# Patient Record
Sex: Female | Born: 1950 | Race: White | Hispanic: No | Smoking: Never smoker
Health system: Southern US, Community
[De-identification: ages and names within clinical notes are randomized; demographics above are authoritative.]

## PROBLEM LIST (undated history)

## (undated) DIAGNOSIS — Z9221 Personal history of antineoplastic chemotherapy: Secondary | ICD-10-CM

## (undated) DIAGNOSIS — E119 Type 2 diabetes mellitus without complications: Secondary | ICD-10-CM

## (undated) DIAGNOSIS — I509 Heart failure, unspecified: Secondary | ICD-10-CM

## (undated) DIAGNOSIS — F32A Depression, unspecified: Secondary | ICD-10-CM

## (undated) DIAGNOSIS — L409 Psoriasis, unspecified: Secondary | ICD-10-CM

## (undated) DIAGNOSIS — F419 Anxiety disorder, unspecified: Secondary | ICD-10-CM

## (undated) DIAGNOSIS — C189 Malignant neoplasm of colon, unspecified: Secondary | ICD-10-CM

## (undated) DIAGNOSIS — I517 Cardiomegaly: Secondary | ICD-10-CM

## (undated) DIAGNOSIS — F329 Major depressive disorder, single episode, unspecified: Secondary | ICD-10-CM

## (undated) DIAGNOSIS — I482 Chronic atrial fibrillation, unspecified: Secondary | ICD-10-CM

## (undated) DIAGNOSIS — L4050 Arthropathic psoriasis, unspecified: Secondary | ICD-10-CM

## (undated) DIAGNOSIS — E785 Hyperlipidemia, unspecified: Secondary | ICD-10-CM

## (undated) HISTORY — PX: COLON RESECTION: SHX5231

## (undated) HISTORY — PX: THYROIDECTOMY: SHX17

## (undated) HISTORY — PX: ATRIAL FIBRILLATION ABLATION: SHX5732

## (undated) HISTORY — DX: Psoriasis, unspecified: L40.9

## (undated) HISTORY — PX: CHOLECYSTECTOMY: SHX55

---
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
## 2000-04-14 DIAGNOSIS — B1920 Unspecified viral hepatitis C without hepatic coma: Secondary | ICD-10-CM

## 2000-04-14 DIAGNOSIS — C189 Malignant neoplasm of colon, unspecified: Secondary | ICD-10-CM

## 2000-04-14 HISTORY — DX: Malignant neoplasm of colon, unspecified: C18.9

## 2000-04-14 HISTORY — DX: Unspecified viral hepatitis C without hepatic coma: B19.20

## 2004-04-24 ENCOUNTER — Ambulatory Visit: Payer: Self-pay

## 2006-04-14 DIAGNOSIS — I219 Acute myocardial infarction, unspecified: Secondary | ICD-10-CM

## 2006-04-14 HISTORY — DX: Acute myocardial infarction, unspecified: I21.9

## 2006-08-14 ENCOUNTER — Ambulatory Visit: Payer: Self-pay | Admitting: Cardiovascular Disease

## 2006-08-18 ENCOUNTER — Ambulatory Visit: Payer: Self-pay | Admitting: Cardiovascular Disease

## 2009-04-18 ENCOUNTER — Ambulatory Visit: Payer: Self-pay

## 2009-08-06 ENCOUNTER — Inpatient Hospital Stay: Payer: Self-pay | Admitting: Surgery

## 2009-08-23 ENCOUNTER — Ambulatory Visit: Payer: Self-pay | Admitting: Surgery

## 2009-08-29 ENCOUNTER — Ambulatory Visit: Payer: Self-pay | Admitting: Surgery

## 2010-03-25 ENCOUNTER — Ambulatory Visit: Payer: Self-pay | Admitting: Cardiovascular Disease

## 2010-10-10 ENCOUNTER — Other Ambulatory Visit: Payer: Self-pay | Admitting: Physician Assistant

## 2010-10-11 ENCOUNTER — Other Ambulatory Visit: Payer: Self-pay | Admitting: Physician Assistant

## 2010-10-11 ENCOUNTER — Ambulatory Visit: Payer: Self-pay | Admitting: Cardiovascular Disease

## 2010-10-23 ENCOUNTER — Ambulatory Visit: Payer: Self-pay | Admitting: Gastroenterology

## 2016-01-23 ENCOUNTER — Ambulatory Visit: Payer: Self-pay

## 2016-04-14 DIAGNOSIS — R42 Dizziness and giddiness: Secondary | ICD-10-CM

## 2016-04-14 HISTORY — DX: Dizziness and giddiness: R42

## 2017-06-18 ENCOUNTER — Other Ambulatory Visit: Payer: Self-pay | Admitting: Nurse Practitioner

## 2017-06-18 DIAGNOSIS — Z1231 Encounter for screening mammogram for malignant neoplasm of breast: Secondary | ICD-10-CM

## 2017-07-29 ENCOUNTER — Encounter (HOSPITAL_COMMUNITY): Payer: Self-pay

## 2017-07-29 ENCOUNTER — Ambulatory Visit
Admission: RE | Admit: 2017-07-29 | Discharge: 2017-07-29 | Disposition: A | Discharge status: Home / Self Care | Payer: Medicare Other | Source: Ambulatory Visit | Attending: Nurse Practitioner | Admitting: Nurse Practitioner

## 2017-07-29 DIAGNOSIS — Z1231 Encounter for screening mammogram for malignant neoplasm of breast: Secondary | ICD-10-CM | POA: Diagnosis present

## 2017-07-29 HISTORY — DX: Personal history of antineoplastic chemotherapy: Z92.21

## 2017-07-29 HISTORY — DX: Malignant neoplasm of colon, unspecified: C18.9

## 2018-03-08 ENCOUNTER — Other Ambulatory Visit: Payer: Self-pay | Admitting: Nurse Practitioner

## 2018-03-08 DIAGNOSIS — Z13820 Encounter for screening for osteoporosis: Secondary | ICD-10-CM

## 2018-03-15 ENCOUNTER — Ambulatory Visit
Admission: RE | Admit: 2018-03-15 | Discharge: 2018-03-15 | Disposition: A | Discharge status: Home / Self Care | Payer: Medicare Other | Source: Ambulatory Visit | Attending: Nurse Practitioner | Admitting: Nurse Practitioner

## 2018-03-15 DIAGNOSIS — Z780 Asymptomatic menopausal state: Secondary | ICD-10-CM | POA: Insufficient documentation

## 2018-03-15 DIAGNOSIS — Z13820 Encounter for screening for osteoporosis: Secondary | ICD-10-CM

## 2019-03-20 DIAGNOSIS — U071 COVID-19: Secondary | ICD-10-CM

## 2019-03-20 HISTORY — DX: COVID-19: U07.1

## 2019-06-13 ENCOUNTER — Other Ambulatory Visit: Payer: Self-pay | Admitting: Family Medicine

## 2019-06-13 DIAGNOSIS — Z1231 Encounter for screening mammogram for malignant neoplasm of breast: Secondary | ICD-10-CM

## 2019-06-25 DIAGNOSIS — L409 Psoriasis, unspecified: Secondary | ICD-10-CM | POA: Insufficient documentation

## 2019-06-29 ENCOUNTER — Other Ambulatory Visit: Payer: Self-pay

## 2019-06-29 ENCOUNTER — Ambulatory Visit
Admission: RE | Admit: 2019-06-29 | Discharge: 2019-06-29 | Disposition: A | Discharge status: Home / Self Care | Payer: Medicare Other | Source: Ambulatory Visit | Attending: Family Medicine | Admitting: Family Medicine

## 2019-06-29 DIAGNOSIS — Z1231 Encounter for screening mammogram for malignant neoplasm of breast: Secondary | ICD-10-CM

## 2019-07-06 ENCOUNTER — Other Ambulatory Visit: Payer: Self-pay | Admitting: Family Medicine

## 2019-07-06 DIAGNOSIS — N6489 Other specified disorders of breast: Secondary | ICD-10-CM

## 2019-07-06 DIAGNOSIS — R928 Other abnormal and inconclusive findings on diagnostic imaging of breast: Secondary | ICD-10-CM

## 2019-07-12 ENCOUNTER — Other Ambulatory Visit: Payer: Self-pay

## 2019-07-12 ENCOUNTER — Ambulatory Visit (INDEPENDENT_AMBULATORY_CARE_PROVIDER_SITE_OTHER): Payer: Medicare Other | Admitting: Dermatology

## 2019-07-12 ENCOUNTER — Encounter: Payer: Self-pay | Admitting: Dermatology

## 2019-07-12 DIAGNOSIS — B079 Viral wart, unspecified: Secondary | ICD-10-CM | POA: Diagnosis not present

## 2019-07-12 DIAGNOSIS — L409 Psoriasis, unspecified: Secondary | ICD-10-CM | POA: Diagnosis not present

## 2019-07-12 DIAGNOSIS — L821 Other seborrheic keratosis: Secondary | ICD-10-CM

## 2019-07-12 NOTE — Patient Instructions (Signed)
Liquid nitrogen was applied for 10-12 seconds to the skin lesion and the expected blistering or scabbing reaction explained. Do not pick at the area. Patient reminded to expect hypopigmented scars from the procedure. Return if lesion fails to fully resolve.

## 2019-07-12 NOTE — Progress Notes (Signed)
   Follow-Up Visit   Subjective  Diane Gardner is a 69 y.o. female who presents for the following: Psoriasis (follow up, talk about psoriatic arthritis, and starting Cosentyx).  Pt was unable to tolerate Otezla.  Ears still extremely itchy.  Scalp somewhat improved, but still itches.  Want her scalp checked because she has felt a bump there off and on.  She also has a painful new wart on her finger she would like removed.   The following portions of the chart were reviewed this encounter and updated as appropriate:    Review of Systems: No other skin or systemic complaints.  Objective  Well appearing patient in no apparent distress; mood and affect are within normal limits.  A focused examination was performed including scalp, ears, arms. Relevant physical exam findings are noted in the Assessment and Plan.  Objective  Left Ear, Mid Frontal Scalp, Right Ear, Right Proximal 3rd Finger: Erythema and scale B/L ear concha and canals with excoriations, mild erythema/scale scalp.   Objective  Crown: Stuck-on, waxy, tan-brown macule --Discussed benign etiology and prognosis.   Objective  Right Distal 4th Finger: 3 mm Verrucous papule  Assessment & Plan  Psoriasis (4) Left Ear; Right Ear; Right Proximal 3rd Finger; Mid Frontal Scalp  With psoriatic arthritis Continue Dermasmoothe Oil to scalp and ears as directed. Continue Ketoconazole 2% shampoo as directed. Pt is approved for Cosentyx, but copay is not affordable. She will fill out the paperwork for patient assistance and submit.  Seborrheic keratosis Crown  Benign, observe.    Viral warts, unspecified type Right Distal 4th Finger  Discussed viral etiology and risk of spread.  Discussed multiple treatments may be required to clear warts.  Discussed possible post-treatment dyspigmentation and risk of recurrence.   Destruction of lesion - Right Distal 4th Finger  Destruction method: cryotherapy   Informed consent:  discussed and consent obtained   Lesion destroyed using liquid nitrogen: Yes   Region frozen until ice ball extended beyond lesion: Yes   Outcome: patient tolerated procedure well with no complications   Post-procedure details: wound care instructions given    Return in about 4 weeks (around 08/09/2019) for wart/cosentyx start?Marene Lenz, CMA, am acting as scribe for Brendolyn Patty, MD .

## 2019-07-14 ENCOUNTER — Ambulatory Visit
Admission: RE | Admit: 2019-07-14 | Discharge: 2019-07-14 | Disposition: A | Discharge status: Home / Self Care | Payer: Medicare Other | Source: Ambulatory Visit | Attending: Family Medicine | Admitting: Family Medicine

## 2019-07-14 DIAGNOSIS — R928 Other abnormal and inconclusive findings on diagnostic imaging of breast: Secondary | ICD-10-CM | POA: Insufficient documentation

## 2019-07-14 DIAGNOSIS — N6489 Other specified disorders of breast: Secondary | ICD-10-CM | POA: Insufficient documentation

## 2019-07-20 ENCOUNTER — Other Ambulatory Visit: Payer: Self-pay

## 2019-08-12 ENCOUNTER — Ambulatory Visit (INDEPENDENT_AMBULATORY_CARE_PROVIDER_SITE_OTHER): Payer: Medicare Other | Admitting: Dermatology

## 2019-08-12 ENCOUNTER — Other Ambulatory Visit: Payer: Self-pay

## 2019-08-12 DIAGNOSIS — L4050 Arthropathic psoriasis, unspecified: Secondary | ICD-10-CM

## 2019-08-12 DIAGNOSIS — Z79899 Other long term (current) drug therapy: Secondary | ICD-10-CM

## 2019-08-12 DIAGNOSIS — L409 Psoriasis, unspecified: Secondary | ICD-10-CM

## 2019-08-12 MED ORDER — SECUKINUMAB (300 MG DOSE) 150 MG/ML ~~LOC~~ SOAJ
300.0000 mg | Freq: Once | SUBCUTANEOUS | Status: AC
  Administered 2019-08-12: 12:00:00 300 mg via SUBCUTANEOUS

## 2019-08-12 NOTE — Progress Notes (Signed)
   Follow-Up Visit   Subjective  Diane Gardner is a 69 y.o. female who presents for the following: Psoriasis (Ear are giving her a fit, they are extremly itchy, Hopeing to start Cosentyx today.) and Finger nail (Right fingernail changing color, painful when bump it.). Patient has joint pain with stiffness in Bilateral elbows, fingers and ankles. Pt uses Dermasmoothe oil for scalp and ears.  Helps scalp, but not ears.  The following portions of the chart were reviewed this encounter and updated as appropriate:     Review of Systems:  No other skin or systemic complaints except as noted in HPI or Assessment and Plan.  Objective  Well appearing patient in no apparent distress; mood and affect are within normal limits.  A focused examination was performed including Ears, Scalp, b/l ankles, elbows and hands. Relevant physical exam findings are noted in the Assessment and Plan.  Objective  bilateral ear, hands,ankles, elbows, and scalp: Pink scaliness in ear canals, concha Nail dystrophy with onycholysis with linear pink brown pigmented streak Right 4th finger, prominent joint enlargement fingers, scalp clear    Assessment & Plan  PSA (psoriatic arthritis) (HCC)  Psoriasis bilateral ear, hands,ankles, elbows, and scalp  With PsA Start Cosentyx, 150mg /ml x 2 injections for the 5 weeks, then once monthly  Patient received her first dose 150mg /ml injection x 2 (300mg /ml total) today. Samples given. Patient tolerated well.  Pt is in process of getting approved for pt assistance.  Secukinumab (300 MG Dose) SOAJ 300 mg - bilateral ear, hands,ankles, elbows, and scalp Long term medication management. Return in about 1 week (around 08/19/2019) for Cosentyx- nurse visit, use samples if her medicine hasn't yet arrived.  Marene Lenz, CMA, am acting as scribe for Brendolyn Patty, MD .   Documentation: I have reviewed the above documentation for accuracy and completeness, and I agree  with the above.  Brendolyn Patty, MD

## 2019-08-19 ENCOUNTER — Ambulatory Visit (INDEPENDENT_AMBULATORY_CARE_PROVIDER_SITE_OTHER): Payer: Medicare Other

## 2019-08-19 ENCOUNTER — Other Ambulatory Visit: Payer: Self-pay

## 2019-08-19 DIAGNOSIS — L400 Psoriasis vulgaris: Secondary | ICD-10-CM | POA: Diagnosis not present

## 2019-08-19 MED ORDER — SECUKINUMAB (300 MG DOSE) 150 MG/ML ~~LOC~~ SOAJ
300.0000 mg | Freq: Once | SUBCUTANEOUS | Status: AC
  Administered 2019-08-19: 300 mg via SUBCUTANEOUS

## 2019-08-19 NOTE — Patient Instructions (Signed)
Psoriasis Psoriasis is a long-term (chronic) skin condition. It occurs because your body's defense system (immune system) causes skin cells to form too quickly. This causes raised, red patches (plaques) on your skin that look silvery. The patches may be on all areas of your body. They can be any size or shape. Psoriasis can come and go. It can range from mild to very bad. It cannot be passed from one person to another (is not contagious). There is no cure for this condition, but it can be helped with treatment. What are the causes? The cause of psoriasis is not known. Some things can make it worse. These are:  Skin damage, such as cuts, scrapes, sunburn, and dryness.  Not getting enough sunlight.  Some medicines.  Alcohol.  Tobacco.  Stress.  Infections. What increases the risk?  Having a family member with psoriasis.  Being very overweight (obese).  Being 20-40 years old.  Taking certain medicines. What are the signs or symptoms? There are different types of psoriasis. The types are:  Plaque. This is the most common. Symptoms include red, raised patches with a silvery coating. These may be itchy. Your nails may be crumbly or fall off.  Guttate. Symptoms include small red spots on your stomach area, arms, and legs. These may happen after you have been sick, such as with strep throat.  Inverse. Symptoms include patches in your armpits, under your breasts, private areas, or on your butt.  Pustular. Symptoms include pus-filled bumps on the palms of your hands or the soles of your feet. You also may feel very tired, weak, have a fever, and not be hungry.  Erythrodermic. Symptoms include bright red skin that looks burned. You may have a fast heartbeat and a body temperature that is too high or too low. You may be itchy or in pain.  Sebopsoriasis. Symptoms include red patches on your scalp, forehead, and face that are greasy.  Psoriatic arthritis. Symptoms include swollen,  painful joints along with scaly skin patches. How is this treated? There is no cure for this condition, but treatment can:  Help your skin heal.  Lessen itching and irritation and swelling (inflammation).  Slow the growth of new skin cells.  Help your body's defense system respond better to your skin. Treatment may include:  Creams or ointments.  Light therapy. This may include natural sunlight or light therapy in a doctor's office.  Medicines. These can help your body better manage skin cells. They may be used with light therapy or ointments. Medicines may include pills or injections. You may also get antibiotic medicines if you have an infection. Follow these instructions at home: Skin Care  Apply lotion to your skin as needed. Only use those that your doctor has said are okay.  Apply cool, wet cloths (cold compresses) to the affected areas.  Do not use a hot tub or take hot showers. Use slightly warm, not hot, water when taking showers and baths.  Do not scratch your skin. Lifestyle   Do not use any products that contain nicotine or tobacco, such as cigarettes, e-cigarettes, and chewing tobacco. If you need help quitting, ask your doctor.  Lower your stress.  Keep a healthy weight.  Go out in the sun as told by your doctor. Do not get sunburned.  Join a support group. Medicines  Take or use over-the-counter and prescription medicines only as told by your doctor.  If you were prescribed an antibiotic medicine, take it as told by your doctor.   Do not stop using the antibiotic even if you start to feel better. Alcohol use If you drink alcohol:  Limit how much you use: ? 0-1 drink a day for women. ? 0-2 drinks a day for men.  Be aware of how much alcohol is in your drink. In the [place] one drink equals one 12 oz bottle of beer (355 mL), one 5 oz glass of wine (148 mL), or one 1 oz glass of hard liquor (44 mL). General instructions  Keep a journal to track the  things that cause symptoms (triggers). Try to avoid these things.  See a counselor if you feel the support would help.  Keep all follow-up visits as told by your doctor. This is important. Contact a doctor if:  You have a fever.  Your pain gets worse.  You have more redness or warmth in the affected areas.  You have new or worse pain or stiffness in your joints.  Your nails start to break easily or pull away from the nail bed.  You feel very sad (depressed). Summary  Psoriasis is a long-term (chronic) skin condition.  There is no cure for this condition, but treatment can help manage it.  Keep a journal to track the things that cause symptoms.  Take or use over-the-counter and prescription medicines only as told by your doctor.  Keep all follow-up visits as told by your doctor. This is important. This information is not intended to replace advice given to you by your health care provider. Make sure you discuss any questions you have with your health care provider. Document Revised: 02/02/2018 Document Reviewed: 02/02/2018 Elsevier Patient Education  2020 [employer]  

## 2019-08-19 NOTE — Progress Notes (Signed)
Cosentyx 150mg /mL X2 Pens injected SQ into both left and right upper arm. Patient tolerated well.   LOT: [id number] EXP: MAY 2022

## 2019-08-22 ENCOUNTER — Encounter: Payer: Self-pay | Admitting: Ophthalmology

## 2019-08-22 ENCOUNTER — Other Ambulatory Visit: Payer: Self-pay

## 2019-08-25 ENCOUNTER — Other Ambulatory Visit: Payer: Self-pay

## 2019-08-25 ENCOUNTER — Ambulatory Visit: Payer: 59

## 2019-08-25 ENCOUNTER — Other Ambulatory Visit
Admission: RE | Admit: 2019-08-25 | Discharge: 2019-08-25 | Disposition: A | Discharge status: Home / Self Care | Payer: Medicare Other | Source: Ambulatory Visit | Attending: Ophthalmology | Admitting: Ophthalmology

## 2019-08-25 DIAGNOSIS — Z20822 Contact with and (suspected) exposure to covid-19: Secondary | ICD-10-CM | POA: Insufficient documentation

## 2019-08-25 DIAGNOSIS — Z01812 Encounter for preprocedural laboratory examination: Secondary | ICD-10-CM | POA: Diagnosis present

## 2019-08-25 LAB — SARS CORONAVIRUS 2 (TAT 6-24 HRS): SARS Coronavirus 2: NEGATIVE

## 2019-08-25 NOTE — Discharge Instructions (Signed)

## 2019-08-29 ENCOUNTER — Ambulatory Visit: Payer: Medicare Other | Admitting: Anesthesiology

## 2019-08-29 ENCOUNTER — Ambulatory Visit
Admission: RE | Admit: 2019-08-29 | Discharge: 2019-08-29 | Disposition: A | Discharge status: Home / Self Care | Payer: Medicare Other | Attending: Ophthalmology | Admitting: Ophthalmology

## 2019-08-29 ENCOUNTER — Encounter
Admission: RE | Disposition: A | Discharge status: Home / Self Care | Payer: Self-pay | Source: Home / Self Care | Attending: Ophthalmology

## 2019-08-29 ENCOUNTER — Other Ambulatory Visit: Payer: Self-pay

## 2019-08-29 DIAGNOSIS — I509 Heart failure, unspecified: Secondary | ICD-10-CM | POA: Insufficient documentation

## 2019-08-29 DIAGNOSIS — E7800 Pure hypercholesterolemia, unspecified: Secondary | ICD-10-CM | POA: Diagnosis not present

## 2019-08-29 DIAGNOSIS — I252 Old myocardial infarction: Secondary | ICD-10-CM | POA: Diagnosis not present

## 2019-08-29 DIAGNOSIS — Z8616 Personal history of COVID-19: Secondary | ICD-10-CM | POA: Insufficient documentation

## 2019-08-29 DIAGNOSIS — I110 Hypertensive heart disease with heart failure: Secondary | ICD-10-CM | POA: Insufficient documentation

## 2019-08-29 DIAGNOSIS — Z8619 Personal history of other infectious and parasitic diseases: Secondary | ICD-10-CM | POA: Diagnosis not present

## 2019-08-29 DIAGNOSIS — Z79890 Hormone replacement therapy (postmenopausal): Secondary | ICD-10-CM | POA: Diagnosis not present

## 2019-08-29 DIAGNOSIS — R251 Tremor, unspecified: Secondary | ICD-10-CM | POA: Diagnosis not present

## 2019-08-29 DIAGNOSIS — H2511 Age-related nuclear cataract, right eye: Secondary | ICD-10-CM | POA: Insufficient documentation

## 2019-08-29 DIAGNOSIS — Z880 Allergy status to penicillin: Secondary | ICD-10-CM | POA: Diagnosis not present

## 2019-08-29 DIAGNOSIS — L4050 Arthropathic psoriasis, unspecified: Secondary | ICD-10-CM | POA: Diagnosis not present

## 2019-08-29 DIAGNOSIS — E890 Postprocedural hypothyroidism: Secondary | ICD-10-CM | POA: Diagnosis not present

## 2019-08-29 DIAGNOSIS — I2510 Atherosclerotic heart disease of native coronary artery without angina pectoris: Secondary | ICD-10-CM | POA: Insufficient documentation

## 2019-08-29 DIAGNOSIS — Z882 Allergy status to sulfonamides status: Secondary | ICD-10-CM | POA: Diagnosis not present

## 2019-08-29 DIAGNOSIS — E119 Type 2 diabetes mellitus without complications: Secondary | ICD-10-CM | POA: Diagnosis not present

## 2019-08-29 DIAGNOSIS — I4819 Other persistent atrial fibrillation: Secondary | ICD-10-CM | POA: Diagnosis not present

## 2019-08-29 DIAGNOSIS — Z79899 Other long term (current) drug therapy: Secondary | ICD-10-CM | POA: Insufficient documentation

## 2019-08-29 DIAGNOSIS — Z881 Allergy status to other antibiotic agents status: Secondary | ICD-10-CM | POA: Diagnosis not present

## 2019-08-29 DIAGNOSIS — Z7901 Long term (current) use of anticoagulants: Secondary | ICD-10-CM | POA: Diagnosis not present

## 2019-08-29 HISTORY — DX: Heart failure, unspecified: I50.9

## 2019-08-29 HISTORY — DX: Anxiety disorder, unspecified: F41.9

## 2019-08-29 HISTORY — DX: Cardiomegaly: I51.7

## 2019-08-29 HISTORY — DX: Arthropathic psoriasis, unspecified: L40.50

## 2019-08-29 HISTORY — DX: Chronic atrial fibrillation, unspecified: I48.20

## 2019-08-29 HISTORY — PX: CATARACT EXTRACTION W/PHACO: SHX586

## 2019-08-29 HISTORY — DX: Type 2 diabetes mellitus without complications: E11.9

## 2019-08-29 HISTORY — DX: Depression, unspecified: F32.A

## 2019-08-29 HISTORY — DX: Hyperlipidemia, unspecified: E78.5

## 2019-08-29 LAB — GLUCOSE, CAPILLARY
Glucose-Capillary: 230 mg/dL — ABNORMAL HIGH (ref 70–99)
Glucose-Capillary: 214 mg/dL — ABNORMAL HIGH (ref 70–99)

## 2019-08-29 SURGERY — PHACOEMULSIFICATION, CATARACT, WITH IOL INSERTION
Anesthesia: Monitor Anesthesia Care | Site: Eye | Laterality: Right

## 2019-08-29 MED ORDER — FENTANYL CITRATE (PF) 100 MCG/2ML IJ SOLN
INTRAMUSCULAR | Status: DC | PRN
  Administered 2019-08-29 (×2): 50 ug via INTRAVENOUS

## 2019-08-29 MED ORDER — MOXIFLOXACIN HCL 0.5 % OP SOLN
OPHTHALMIC | Status: DC | PRN
  Administered 2019-08-29: 0.2 mL via OPHTHALMIC

## 2019-08-29 MED ORDER — TETRACAINE HCL 0.5 % OP SOLN
1.0000 [drp] | OPHTHALMIC | Status: DC | PRN
  Administered 2019-08-29 (×3): 1 [drp] via OPHTHALMIC

## 2019-08-29 MED ORDER — LIDOCAINE HCL (PF) 2 % IJ SOLN
INTRAOCULAR | Status: DC | PRN
  Administered 2019-08-29: 1 mL via INTRAOCULAR

## 2019-08-29 MED ORDER — ARMC OPHTHALMIC DILATING DROPS
1.0000 "application " | OPHTHALMIC | Status: DC | PRN
  Administered 2019-08-29 (×3): 1 via OPHTHALMIC

## 2019-08-29 MED ORDER — SODIUM HYALURONATE 23 MG/ML IO SOLN
INTRAOCULAR | Status: DC | PRN
  Administered 2019-08-29: 0.6 mL via INTRAOCULAR

## 2019-08-29 MED ORDER — EPINEPHRINE PF 1 MG/ML IJ SOLN
INTRAOCULAR | Status: DC | PRN
  Administered 2019-08-29: 86 mL via OPHTHALMIC

## 2019-08-29 MED ORDER — LACTATED RINGERS IV SOLN: INTRAVENOUS | Status: DC

## 2019-08-29 MED ORDER — MIDAZOLAM HCL 2 MG/2ML IJ SOLN
INTRAMUSCULAR | Status: DC | PRN
  Administered 2019-08-29 (×2): 1 mg via INTRAVENOUS

## 2019-08-29 MED ORDER — SODIUM HYALURONATE 10 MG/ML IO SOLN
INTRAOCULAR | Status: DC | PRN
  Administered 2019-08-29: 0.55 mL via INTRAOCULAR

## 2019-08-29 SURGICAL SUPPLY — 19 items
CANNULA ANT/CHMB 27G (MISCELLANEOUS) ×2 IMPLANT
CANNULA ANT/CHMB 27GA (MISCELLANEOUS) ×6 IMPLANT
DISSECTOR HYDRO NUCLEUS 50X22 (MISCELLANEOUS) ×3 IMPLANT
GLOVE SURG LX 7.5 STRW (GLOVE) ×2
GLOVE SURG LX STRL 7.5 STRW (GLOVE) ×1 IMPLANT
GLOVE SURG SYN 8.5  E (GLOVE) ×3
GLOVE SURG SYN 8.5 E (GLOVE) ×1 IMPLANT
GLOVE SURG SYN 8.5 PF PI (GLOVE) ×1 IMPLANT
GOWN STRL REUS W/ TWL LRG LVL3 (GOWN DISPOSABLE) ×2 IMPLANT
GOWN STRL REUS W/TWL LRG LVL3 (GOWN DISPOSABLE) ×6
LENS IOL TECNIS ITEC 14.0 (Intraocular Lens) ×2 IMPLANT
MARKER SKIN DUAL TIP RULER LAB (MISCELLANEOUS) ×3 IMPLANT
PACK DR. KING ARMS (PACKS) ×3 IMPLANT
PACK EYE AFTER SURG (MISCELLANEOUS) ×3 IMPLANT
PACK OPTHALMIC (MISCELLANEOUS) ×3 IMPLANT
SYR 3ML LL SCALE MARK (SYRINGE) ×3 IMPLANT
SYR TB 1ML LUER SLIP (SYRINGE) ×3 IMPLANT
WATER STERILE IRR 250ML POUR (IV SOLUTION) ×3 IMPLANT
WIPE NON LINTING 3.25X3.25 (MISCELLANEOUS) ×3 IMPLANT

## 2019-08-29 NOTE — Op Note (Signed)
OPERATIVE NOTE  NAA CLAPSADDLE [id number] 08/29/2019   PREOPERATIVE DIAGNOSIS:  Nuclear sclerotic cataract right eye.  H25.11   POSTOPERATIVE DIAGNOSIS:    Nuclear sclerotic cataract right eye.     PROCEDURE:  Phacoemusification with posterior chamber intraocular lens placement of the right eye   LENS:   Implant Name Type Inv. Item Serial No. Manufacturer Lot No. LRB No. Used Action  LENS IOL DIOP 14.0 - [id number] Intraocular Lens LENS IOL DIOP 14.0 [id number]  Right 1 Implanted       Procedure(s) with comments: CATARACT EXTRACTION PHACO AND INTRAOCULAR LENS PLACEMENT (IOC) RIGHT DIABETIC 5.93  00:41.7 (Right) - Diabetic - oral meds  PCB00 +14.0   ULTRASOUND TIME: 0 minutes 41 seconds.  CDE 5.93   SURGEON:  Benay Pillow, MD, MPH  ANESTHESIOLOGIST: Anesthesiologist: Ardeth Sportsman, MD CRNA: Vanetta Shawl, CRNA   ANESTHESIA:  Topical with tetracaine drops augmented with 1% preservative-free intracameral lidocaine.  ESTIMATED BLOOD LOSS: less than 1 mL.   COMPLICATIONS:  None.   DESCRIPTION OF PROCEDURE:  The patient was identified in the holding room and transported to the operating room and placed in the supine position under the operating microscope.  The right eye was identified as the operative eye and it was prepped and draped in the usual sterile ophthalmic fashion.   A 1.0 millimeter clear-corneal paracentesis was made at the 10:30 position. 0.5 ml of preservative-free 1% lidocaine with epinephrine was injected into the anterior chamber.  The anterior chamber was filled with Healon 5 viscoelastic.  A 2.4 millimeter keratome was used to make a near-clear corneal incision at the 8:00 position.  A curvilinear capsulorrhexis was made with a cystotome and capsulorrhexis forceps.  Balanced salt solution was used to hydrodissect and hydrodelineate the nucleus.   Phacoemulsification was then used in stop and chop fashion to remove the lens nucleus and epinucleus.  The  remaining cortex was then removed using the irrigation and aspiration handpiece. Healon was then placed into the capsular bag to distend it for lens placement.  A lens was then injected into the capsular bag.  The remaining viscoelastic was aspirated.   Wounds were hydrated with balanced salt solution.  The anterior chamber was inflated to a physiologic pressure with balanced salt solution.   Intracameral vigamox 0.1 mL undiluted was injected into the eye and a drop placed onto the ocular surface.  No wound leaks were noted.  The patient was taken to the recovery room in stable condition without complications of anesthesia or surgery  Benay Pillow 08/29/2019, 8:45 AM

## 2019-08-29 NOTE — Anesthesia Procedure Notes (Signed)
Procedure Name: MAC Date/Time: 08/29/2019 8:22 AM Performed by: Vanetta Shawl, CRNA Pre-anesthesia Checklist: Patient identified, Emergency Drugs available, Suction available, Timeout performed and Patient being monitored Patient Re-evaluated:Patient Re-evaluated prior to induction Oxygen Delivery Method: Nasal cannula Placement Confirmation: positive ETCO2

## 2019-08-29 NOTE — H&P (Signed)

## 2019-08-29 NOTE — Anesthesia Preprocedure Evaluation (Addendum)
Anesthesia Evaluation  Patient identified by MRN, date of birth, ID band Patient awake    Reviewed: Allergy & Precautions, NPO status , Patient's Chart, lab work & pertinent test results  Airway Mallampati: II  TM Distance: >3 FB Neck ROM: Full    Dental no notable dental hx.    Pulmonary  Covid positive 03/2019, no current symptoms   Pulmonary exam normal        Cardiovascular + Past MI  Normal cardiovascular exam+ dysrhythmias (ablation x3) Atrial Fibrillation   ECHO 05/24/2019  1. The left ventricle is normal in size with mildly increased wall thickness.  2. The left ventricular systolic function is mildly to moderately decreased, LVEF is visually estimated at 40-45%.  3. There is grade II diastolic dysfunction (elevated filling pressure).  4. The mitral valve leaflets are mildly thickened with normal leaflet mobility.  5. Mitral annular calcification is present.  6. There is mild aortic regurgitation.  7. The left atrium is mildly dilated in size.  8. The right ventricle is normal in size, with normal systolic function.   Neuro/Psych PSYCHIATRIC DISORDERS Anxiety Depression    GI/Hepatic (+) Hepatitis -, C  Endo/Other  diabetes  Renal/GU   negative genitourinary   Musculoskeletal  (+) Arthritis ,   Abdominal Normal abdominal exam  (+)   Peds  Hematology   Anesthesia Other Findings   Reproductive/Obstetrics                            Anesthesia Physical Anesthesia Plan  ASA: III  Anesthesia Plan: MAC   Post-op Pain Management:    Induction: Intravenous  PONV Risk Score and Plan: 2 and TIVA, Midazolam and Treatment may vary due to age or medical condition  Airway Management Planned: Natural Airway and Nasal Cannula  Additional Equipment: None  Intra-op Plan:   Post-operative Plan:   Informed Consent: I have reviewed the patients History and Physical, chart, labs and  discussed the procedure including the risks, benefits and alternatives for the proposed anesthesia with the patient or authorized representative who has indicated his/her understanding and acceptance.       Plan Discussed with: CRNA  Anesthesia Plan Comments:         Anesthesia Quick Evaluation

## 2019-08-29 NOTE — Anesthesia Postprocedure Evaluation (Signed)
Anesthesia Post Note  Patient: Diane Gardner  Procedure(s) Performed: CATARACT EXTRACTION PHACO AND INTRAOCULAR LENS PLACEMENT (IOC) RIGHT DIABETIC 5.93  00:41.7 (Right Eye)     Anesthesia Post Evaluation  Latanja Lehenbauer

## 2019-08-29 NOTE — Transfer of Care (Signed)
Immediate Anesthesia Transfer of Care Note  Patient: Diane Gardner  Procedure(s) Performed: CATARACT EXTRACTION PHACO AND INTRAOCULAR LENS PLACEMENT (IOC) RIGHT DIABETIC 5.93  00:41.7 (Right Eye)  Patient Location: PACU  Anesthesia Type: MAC  Level of Consciousness: awake, alert  and patient cooperative  Airway and Oxygen Therapy: Patient Spontanous Breathing   Post-op Assessment: Post-op Vital signs reviewed, Patient's Cardiovascular Status Stable, Respiratory Function Stable, Patent Airway and No signs of Nausea or vomiting  Post-op Vital Signs: Reviewed and stable  Complications: No apparent anesthesia complications

## 2019-08-30 ENCOUNTER — Encounter: Payer: Self-pay | Admitting: *Deleted

## 2019-09-07 ENCOUNTER — Ambulatory Visit: Payer: 59 | Admitting: Dermatology

## 2019-09-13 ENCOUNTER — Other Ambulatory Visit: Payer: Self-pay

## 2019-09-13 ENCOUNTER — Encounter: Payer: Self-pay | Admitting: Ophthalmology

## 2019-09-15 ENCOUNTER — Other Ambulatory Visit
Admission: RE | Admit: 2019-09-15 | Discharge: 2019-09-15 | Disposition: A | Discharge status: Home / Self Care | Payer: Medicare Other | Source: Ambulatory Visit | Attending: Ophthalmology | Admitting: Ophthalmology

## 2019-09-15 ENCOUNTER — Other Ambulatory Visit: Payer: Self-pay

## 2019-09-15 DIAGNOSIS — Z01812 Encounter for preprocedural laboratory examination: Secondary | ICD-10-CM | POA: Diagnosis present

## 2019-09-15 DIAGNOSIS — Z20822 Contact with and (suspected) exposure to covid-19: Secondary | ICD-10-CM | POA: Diagnosis not present

## 2019-09-15 LAB — SARS CORONAVIRUS 2 (TAT 6-24 HRS): SARS Coronavirus 2: NEGATIVE

## 2019-09-15 NOTE — Discharge Instructions (Signed)

## 2019-09-19 ENCOUNTER — Ambulatory Visit: Payer: Medicare Other | Admitting: Anesthesiology

## 2019-09-19 ENCOUNTER — Encounter: Payer: Self-pay | Admitting: Ophthalmology

## 2019-09-19 ENCOUNTER — Ambulatory Visit
Admission: RE | Admit: 2019-09-19 | Discharge: 2019-09-19 | Disposition: A | Discharge status: Home / Self Care | Payer: Medicare Other | Attending: Ophthalmology | Admitting: Ophthalmology

## 2019-09-19 ENCOUNTER — Other Ambulatory Visit: Payer: Self-pay

## 2019-09-19 ENCOUNTER — Encounter
Admission: RE | Disposition: A | Discharge status: Home / Self Care | Payer: Self-pay | Source: Home / Self Care | Attending: Ophthalmology

## 2019-09-19 DIAGNOSIS — L4050 Arthropathic psoriasis, unspecified: Secondary | ICD-10-CM | POA: Insufficient documentation

## 2019-09-19 DIAGNOSIS — I4891 Unspecified atrial fibrillation: Secondary | ICD-10-CM | POA: Insufficient documentation

## 2019-09-19 DIAGNOSIS — Z882 Allergy status to sulfonamides status: Secondary | ICD-10-CM | POA: Insufficient documentation

## 2019-09-19 DIAGNOSIS — Z8616 Personal history of COVID-19: Secondary | ICD-10-CM | POA: Diagnosis not present

## 2019-09-19 DIAGNOSIS — R251 Tremor, unspecified: Secondary | ICD-10-CM | POA: Diagnosis not present

## 2019-09-19 DIAGNOSIS — Z881 Allergy status to other antibiotic agents status: Secondary | ICD-10-CM | POA: Insufficient documentation

## 2019-09-19 DIAGNOSIS — I509 Heart failure, unspecified: Secondary | ICD-10-CM | POA: Insufficient documentation

## 2019-09-19 DIAGNOSIS — I2510 Atherosclerotic heart disease of native coronary artery without angina pectoris: Secondary | ICD-10-CM | POA: Diagnosis not present

## 2019-09-19 DIAGNOSIS — Z8619 Personal history of other infectious and parasitic diseases: Secondary | ICD-10-CM | POA: Diagnosis not present

## 2019-09-19 DIAGNOSIS — E119 Type 2 diabetes mellitus without complications: Secondary | ICD-10-CM | POA: Insufficient documentation

## 2019-09-19 DIAGNOSIS — H2512 Age-related nuclear cataract, left eye: Secondary | ICD-10-CM | POA: Diagnosis present

## 2019-09-19 DIAGNOSIS — Z880 Allergy status to penicillin: Secondary | ICD-10-CM | POA: Diagnosis not present

## 2019-09-19 DIAGNOSIS — I252 Old myocardial infarction: Secondary | ICD-10-CM | POA: Diagnosis not present

## 2019-09-19 DIAGNOSIS — I110 Hypertensive heart disease with heart failure: Secondary | ICD-10-CM | POA: Diagnosis not present

## 2019-09-19 DIAGNOSIS — E7800 Pure hypercholesterolemia, unspecified: Secondary | ICD-10-CM | POA: Insufficient documentation

## 2019-09-19 DIAGNOSIS — E890 Postprocedural hypothyroidism: Secondary | ICD-10-CM | POA: Diagnosis not present

## 2019-09-19 HISTORY — PX: CATARACT EXTRACTION W/PHACO: SHX586

## 2019-09-19 LAB — GLUCOSE, CAPILLARY
Glucose-Capillary: 250 mg/dL — ABNORMAL HIGH (ref 70–99)
Glucose-Capillary: 221 mg/dL — ABNORMAL HIGH (ref 70–99)

## 2019-09-19 SURGERY — PHACOEMULSIFICATION, CATARACT, WITH IOL INSERTION
Anesthesia: Monitor Anesthesia Care | Site: Eye | Laterality: Left

## 2019-09-19 MED ORDER — ARMC OPHTHALMIC DILATING DROPS
1.0000 "application " | OPHTHALMIC | Status: DC | PRN
  Administered 2019-09-19 (×3): 1 via OPHTHALMIC

## 2019-09-19 MED ORDER — SODIUM HYALURONATE 10 MG/ML IO SOLN
INTRAOCULAR | Status: DC | PRN
  Administered 2019-09-19: 0.55 mL via INTRAOCULAR

## 2019-09-19 MED ORDER — MOXIFLOXACIN HCL 0.5 % OP SOLN
OPHTHALMIC | Status: DC | PRN
  Administered 2019-09-19: 0.2 mL via OPHTHALMIC

## 2019-09-19 MED ORDER — MIDAZOLAM HCL 2 MG/2ML IJ SOLN
INTRAMUSCULAR | Status: DC | PRN
  Administered 2019-09-19 (×2): 1 mg via INTRAVENOUS

## 2019-09-19 MED ORDER — FENTANYL CITRATE (PF) 100 MCG/2ML IJ SOLN
INTRAMUSCULAR | Status: DC | PRN
  Administered 2019-09-19 (×2): 50 ug via INTRAVENOUS

## 2019-09-19 MED ORDER — EPINEPHRINE PF 1 MG/ML IJ SOLN
INTRAOCULAR | Status: DC | PRN
  Administered 2019-09-19: 68 mL via OPHTHALMIC

## 2019-09-19 MED ORDER — LIDOCAINE HCL (PF) 2 % IJ SOLN
INTRAOCULAR | Status: DC | PRN
  Administered 2019-09-19: 1 mL via INTRAOCULAR

## 2019-09-19 MED ORDER — LACTATED RINGERS IV SOLN: 10.0000 mL/h | INTRAVENOUS | Status: DC

## 2019-09-19 MED ORDER — TETRACAINE HCL 0.5 % OP SOLN
1.0000 [drp] | OPHTHALMIC | Status: DC | PRN
  Administered 2019-09-19 (×3): 1 [drp] via OPHTHALMIC

## 2019-09-19 MED ORDER — SODIUM HYALURONATE 23 MG/ML IO SOLN
INTRAOCULAR | Status: DC | PRN
  Administered 2019-09-19: 0.6 mL via INTRAOCULAR

## 2019-09-19 SURGICAL SUPPLY — 20 items
CANNULA ANT/CHMB 27G (MISCELLANEOUS) ×2 IMPLANT
CANNULA ANT/CHMB 27GA (MISCELLANEOUS) ×6 IMPLANT
DISSECTOR HYDRO NUCLEUS 50X22 (MISCELLANEOUS) ×3 IMPLANT
GLOVE SURG LX 7.5 STRW (GLOVE) ×2
GLOVE SURG LX STRL 7.5 STRW (GLOVE) ×1 IMPLANT
GLOVE SURG SYN 8.5  E (GLOVE) ×3
GLOVE SURG SYN 8.5 E (GLOVE) ×1 IMPLANT
GLOVE SURG SYN 8.5 PF PI (GLOVE) ×1 IMPLANT
GOWN STRL REUS W/ TWL LRG LVL3 (GOWN DISPOSABLE) ×2 IMPLANT
GOWN STRL REUS W/TWL LRG LVL3 (GOWN DISPOSABLE) ×6
LENS IOL DIOP 17.0 (Intraocular Lens) ×3 IMPLANT
LENS IOL TECNIS MONO 17.0 (Intraocular Lens) IMPLANT
MARKER SKIN DUAL TIP RULER LAB (MISCELLANEOUS) ×3 IMPLANT
PACK DR. KING ARMS (PACKS) ×3 IMPLANT
PACK EYE AFTER SURG (MISCELLANEOUS) ×3 IMPLANT
PACK OPTHALMIC (MISCELLANEOUS) ×3 IMPLANT
SYR 3ML LL SCALE MARK (SYRINGE) ×3 IMPLANT
SYR TB 1ML LUER SLIP (SYRINGE) ×3 IMPLANT
WATER STERILE IRR 250ML POUR (IV SOLUTION) ×3 IMPLANT
WIPE NON LINTING 3.25X3.25 (MISCELLANEOUS) ×3 IMPLANT

## 2019-09-19 NOTE — Anesthesia Procedure Notes (Signed)
Procedure Name: MAC Date/Time: 09/19/2019 11:34 AM Performed by: Georga Bora, CRNA Pre-anesthesia Checklist: Patient identified, Emergency Drugs available, Suction available, Patient being monitored and Timeout performed Patient Re-evaluated:Patient Re-evaluated prior to induction Oxygen Delivery Method: Nasal cannula

## 2019-09-19 NOTE — H&P (Signed)

## 2019-09-19 NOTE — Anesthesia Postprocedure Evaluation (Signed)
Anesthesia Post Note  Patient: Diane Gardner  Procedure(s) Performed: CATARACT EXTRACTION PHACO AND INTRAOCULAR LENS PLACEMENT (IOC) LEFT DIABETIC 6.45  00:42.3 (Left Eye)     Patient location during evaluation: PACU Anesthesia Type: MAC Level of consciousness: awake and alert and oriented Pain management: satisfactory to patient Vital Signs Assessment: post-procedure vital signs reviewed and stable Respiratory status: spontaneous breathing, nonlabored ventilation and respiratory function stable Cardiovascular status: blood pressure returned to baseline and stable Postop Assessment: Adequate PO intake and No signs of nausea or vomiting Anesthetic complications: no    Raliegh Ip

## 2019-09-19 NOTE — Transfer of Care (Signed)
Immediate Anesthesia Transfer of Care Note  Patient: Diane Gardner  Procedure(s) Performed: CATARACT EXTRACTION PHACO AND INTRAOCULAR LENS PLACEMENT (IOC) LEFT DIABETIC 6.45  00:42.3 (Left Eye)  Patient Location: PACU  Anesthesia Type: MAC  Level of Consciousness: awake, alert  and patient cooperative  Airway and Oxygen Therapy: Patient Spontanous Breathing and Patient connected to supplemental oxygen  Post-op Assessment: Post-op Vital signs reviewed, Patient's Cardiovascular Status Stable, Respiratory Function Stable, Patent Airway and No signs of Nausea or vomiting  Post-op Vital Signs: Reviewed and stable  Complications: No apparent anesthesia complications

## 2019-09-19 NOTE — Op Note (Signed)
OPERATIVE NOTE  [name] [id number] 09/19/2019   PREOPERATIVE DIAGNOSIS:  Nuclear sclerotic cataract left eye.  H25.12   POSTOPERATIVE DIAGNOSIS:    Nuclear sclerotic cataract left eye.     PROCEDURE:  Phacoemusification with posterior chamber intraocular lens placement of the left eye   LENS:   Implant Name Type Inv. Item Serial No. Manufacturer Lot No. LRB No. Used Action  LENS IOL DIOP 17.0 - [id number] Intraocular Lens LENS IOL DIOP 17.0 [id number]  Left 1 Implanted      Procedure(s) with comments: CATARACT EXTRACTION PHACO AND INTRAOCULAR LENS PLACEMENT (IOC) LEFT DIABETIC 6.45  00:42.3 (Left) - Diabetic - oral meds  DCB00 +17.0   ULTRASOUND TIME: 0 minutes 42 seconds.  CDE 6.45   SURGEON:  Benay Pillow, MD, MPH   ANESTHESIA:  Topical with tetracaine drops augmented with 1% preservative-free intracameral lidocaine.  ESTIMATED BLOOD LOSS: <1 mL   COMPLICATIONS:  None.   DESCRIPTION OF PROCEDURE:  The patient was identified in the holding room and transported to the operating room and placed in the supine position under the operating microscope.  The left eye was identified as the operative eye and it was prepped and draped in the usual sterile ophthalmic fashion.   A 1.0 millimeter clear-corneal paracentesis was made at the 5:00 position. 0.5 ml of preservative-free 1% lidocaine with epinephrine was injected into the anterior chamber.  The anterior chamber was filled with Healon 5 viscoelastic.  A 2.4 millimeter keratome was used to make a near-clear corneal incision at the 2:00 position.  A curvilinear capsulorrhexis was made with a cystotome and capsulorrhexis forceps.  Balanced salt solution was used to hydrodissect and hydrodelineate the nucleus.   Phacoemulsification was then used in stop and chop fashion to remove the lens nucleus and epinucleus.  The remaining cortex was then removed using the irrigation and aspiration handpiece. Healon was then placed into  the capsular bag to distend it for lens placement.  A lens was then injected into the capsular bag.  The remaining viscoelastic was aspirated.   Wounds were hydrated with balanced salt solution.  The anterior chamber was inflated to a physiologic pressure with balanced salt solution.  Intracameral vigamox 0.1 mL undiltued was injected into the eye and a drop placed onto the ocular surface.  No wound leaks were noted.  The patient was taken to the recovery room in stable condition without complications of anesthesia or surgery  Benay Pillow 09/19/2019, 11:52 AM

## 2019-09-19 NOTE — Anesthesia Preprocedure Evaluation (Signed)
Anesthesia Evaluation  Patient identified by MRN, date of birth, ID band Patient awake    Reviewed: Allergy & Precautions, H&P , NPO status , Patient's Chart, lab work & pertinent test results  Airway Mallampati: II  TM Distance: >3 FB Neck ROM: full    Dental no notable dental hx.    Pulmonary  Covid positive 03/2019, no current symptoms   Pulmonary exam normal breath sounds clear to auscultation       Cardiovascular + Past MI and +CHF  Normal cardiovascular exam+ dysrhythmias (ablation x3)  Rhythm:regular Rate:Normal  ECHO 05/24/2019  1. The left ventricle is normal in size with mildly increased wall thickness.  2. The left ventricular systolic function is mildly to moderately decreased, LVEF is visually estimated at 40-45%.  3. There is grade II diastolic dysfunction (elevated filling pressure).  4. The mitral valve leaflets are mildly thickened with normal leaflet mobility.  5. Mitral annular calcification is present.  6. There is mild aortic regurgitation.  7. The left atrium is mildly dilated in size.  8. The right ventricle is normal in size, with normal systolic function.   Neuro/Psych PSYCHIATRIC DISORDERS Anxiety Depression    GI/Hepatic (+) Hepatitis -, C  Endo/Other  diabetes  Renal/GU   negative genitourinary   Musculoskeletal  (+) Arthritis ,   Abdominal Normal abdominal exam  (+)   Peds  Hematology   Anesthesia Other Findings   Reproductive/Obstetrics                             Anesthesia Physical  Anesthesia Plan  ASA: III  Anesthesia Plan: MAC   Post-op Pain Management:    Induction: Intravenous  PONV Risk Score and Plan: 2 and Treatment may vary due to age or medical condition, TIVA and Midazolam  Airway Management Planned: Natural Airway and Nasal Cannula  Additional Equipment: None  Intra-op Plan:   Post-operative Plan:   Informed Consent: I  have reviewed the patients History and Physical, chart, labs and discussed the procedure including the risks, benefits and alternatives for the proposed anesthesia with the patient or authorized representative who has indicated his/her understanding and acceptance.     Dental Advisory Given  Plan Discussed with: CRNA  Anesthesia Plan Comments:         Anesthesia Quick Evaluation

## 2019-09-20 ENCOUNTER — Encounter: Payer: Self-pay | Admitting: *Deleted

## 2019-09-20 ENCOUNTER — Telehealth: Payer: Self-pay

## 2019-09-20 NOTE — Telephone Encounter (Signed)
Called [employer] Patient assistance today to reorder Cosentyx for patient. She can not have another shipment until 06/29. Since we started patient early with our samples it is throwing off the dates through patient assistance. Patient should be due in about 2 weeks for her first maintenance dose. We could give patient a sample back so she stays on track for therapy and use the shipment coming in at the end of the month for July so we can have her shipments on track with her injection dates.  I also did confirm shipments can be delivered to our office since patient does not have anyone to sign if she is not home/nor left out in the heat.

## 2019-09-21 ENCOUNTER — Other Ambulatory Visit: Payer: Self-pay

## 2019-09-21 ENCOUNTER — Ambulatory Visit (INDEPENDENT_AMBULATORY_CARE_PROVIDER_SITE_OTHER): Payer: Medicare Other | Admitting: Dermatology

## 2019-09-21 DIAGNOSIS — Z79899 Other long term (current) drug therapy: Secondary | ICD-10-CM | POA: Diagnosis not present

## 2019-09-21 DIAGNOSIS — L409 Psoriasis, unspecified: Secondary | ICD-10-CM | POA: Diagnosis not present

## 2019-09-21 NOTE — Progress Notes (Signed)
° °  Follow-Up Visit   Subjective  Diane Gardner is a 69 y.o. female who presents for the following: Follow-up.  Patient here for a follow up on her Psoriasis. Patient states that her joints are feeling better and her ears are not quite so scaly any more and no sore spots on scalp, since starting Cosentyx. Patient states that she still does using the Dermasmoothe oil in her ears. Patient states that her nails are getting better as well, still sore but not like before. No side effects noted. Patient also just had cataract surgery.  The following portions of the chart were reviewed this encounter and updated as appropriate:      Review of Systems:  No other skin or systemic complaints except as noted in HPI or Assessment and Plan.  Objective  Well appearing patient in no apparent distress; mood and affect are within normal limits.  A focused examination was performed including head and fingers.. Relevant physical exam findings are noted in the Assessment and Plan.   Objective  Left Concha:  mild erythema/scale in BL ear canals, scalp clear, nails clear, decreased joint pain in fingers   Assessment & Plan  Psoriasis Left Concha  Improving.    Continue Cosentyx. Discussed Cosentyx will not cure but will control.  Receiving  medication through [facility]. Has had her 5 weekly injections for loading dose, now will receive every 300 mg q4 weeks   Long term medication management. Return in about 6 months (around 03/22/2020) for Psoriasis.  Marene Lenz, CMA, am acting as scribe for Brendolyn Patty, MD .  Documentation: I have reviewed the above documentation for accuracy and completeness, and I agree with the above.  Brendolyn Patty MD

## 2019-09-21 NOTE — Patient Instructions (Signed)
Recommend daily broad spectrum sunscreen SPF 30+ to sun-exposed areas, reapply every 2 hours as needed. Call for new or changing lesions.  

## 2020-01-30 ENCOUNTER — Telehealth: Payer: Self-pay

## 2020-01-30 NOTE — Telephone Encounter (Signed)
Informed pt of delivery date. Next dose due on 02/09/20.

## 2020-01-30 NOTE — Telephone Encounter (Signed)
Called and scheduled Cosentyx delivery through [employer] [facility]. To be delivered Wednesday 02/01/20.

## 2020-03-20 ENCOUNTER — Other Ambulatory Visit: Payer: Self-pay

## 2020-03-20 ENCOUNTER — Ambulatory Visit (INDEPENDENT_AMBULATORY_CARE_PROVIDER_SITE_OTHER): Payer: Medicare Other | Admitting: Dermatology

## 2020-03-20 DIAGNOSIS — L720 Epidermal cyst: Secondary | ICD-10-CM | POA: Diagnosis not present

## 2020-03-20 DIAGNOSIS — L4050 Arthropathic psoriasis, unspecified: Secondary | ICD-10-CM

## 2020-03-20 DIAGNOSIS — L409 Psoriasis, unspecified: Secondary | ICD-10-CM

## 2020-03-20 MED ORDER — MOMETASONE FUROATE 0.1 % EX CREA: TOPICAL_CREAM | CUTANEOUS | 2 refills | Status: DC

## 2020-03-20 NOTE — Patient Instructions (Addendum)
Reviewed risks of biologics including immunosuppression, infections, injection site reaction, and failure to improve condition. Goal is control of skin condition, not cure.  Some older biologics such as Humira and Enbrel may slightly increase risk of malignancy and may worsen congestive heart failure. The use of biologics requires long term medication management, including periodic office visits and monitoring of blood work.  Labs due 05/2020 Will need Chemistry panel, CBC with differential, TB test.

## 2020-03-20 NOTE — Progress Notes (Signed)
   Follow-Up Visit   Subjective  Diane Gardner is a 69 y.o. female who presents for the following: Psoriasis (ears, scalp ) Patient is improved with Cosentyx injections. Arthritis has improved in hands, elbows, knees, ankles with Cosentyx. She is using Derma-Smoothe oil in ears, but is still having some scaling and itching. Scalp and nails improved. No infections. No side effects.  Labs last done 06/09/19.   The following portions of the chart were reviewed this encounter and updated as appropriate:      Review of Systems:  No other skin or systemic complaints except as noted in HPI or Assessment and Plan.  Objective  Well appearing patient in no apparent distress; mood and affect are within normal limits.  A focused examination was performed including face, scalp, ears. Relevant physical exam findings are noted in the Assessment and Plan.  Objective  Left Ear: Erythema and scale bil preauricular , ear canal, and concha; nails clear; Scalp clear,DIP joint enlargement.  Objective  face: Smooth white papule(s).    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  Skin improved but not clear  Start mometasone cream Apply BID to AAs ears until improved dsp 15g 2Rf. Continue Dermotic oil to ear canals qd/bid  Continue Cosentyx injections 300mg  sq q 4 wks.  Patient due for labs in 05/2020. She will have labs done at [facility], will add TB test, and have faxed to [place].  Reviewed risks of biologics including immunosuppression, infections, injection site reaction, and failure to improve condition. Goal is control of skin condition, not cure.  Some older biologics such as Humira and Enbrel may slightly increase risk of malignancy and may worsen congestive heart failure. The use of biologics requires long term medication management, including periodic office visits and monitoring of blood work.   Topical steroids (such as  triamcinolone, fluocinolone, fluocinonide, mometasone, clobetasol, halobetasol, betamethasone, hydrocortisone) can cause thinning and lightening of the skin if they are used for too long in the same area. Your physician has selected the right strength medicine for your problem and area affected on the body. Please use your medication only as directed by your physician to prevent side effects.    mometasone (ELOCON) 0.1 % cream - Left Ear  Milia face  Start T[place]orac 0.05% Cream apply a small amount to AA qhs, sample given. Risk drying.  Return in about 6 months (around 09/18/2020) for psoriasis, Cosentyx.   IJamesetta Orleans, CMA, am acting as scribe for Brendolyn Patty, MD .  Documentation: I have reviewed the above documentation for accuracy and completeness, and I agree with the above.  Brendolyn Patty MD

## 2020-04-12 ENCOUNTER — Telehealth: Payer: Self-pay

## 2020-04-12 NOTE — Telephone Encounter (Signed)
Called [employer] Patient Asst and scheduled next delivery. Cosentyx will be here Tuesday January 4th. If she comes in to pick up injections before we can provide her with sample box.

## 2020-05-03 ENCOUNTER — Telehealth: Payer: Self-pay

## 2020-05-03 NOTE — Telephone Encounter (Signed)
Patient called to let us know she has been majorly exposed to the shingles. She just left from seeing her PCP who is doing blood work but was going to draw for her TB as well for her Cosentyx. Patient has asked if it is safe and okay for her to receive the shingles vaccine while taking Cosentyx?

## 2020-05-07 NOTE — Telephone Encounter (Signed)
Shingrix (the newer shingles vaccine) is not a live vaccine, so it can be given while on Cosentyx.  There may be a small risk that it doesn't work as well when you take it while on a biologic medication, but it is not dangerous to your health.  You don't need to stop the Cosentyx to take Shingrix.

## 2020-05-08 NOTE — Telephone Encounter (Signed)
Left message for patient to return my call.

## 2020-05-16 NOTE — Telephone Encounter (Signed)
Patient advised of information per Dr. Nicole Kindred.

## 2020-06-06 ENCOUNTER — Telehealth: Payer: Self-pay

## 2020-06-06 NOTE — Telephone Encounter (Signed)
Patient came into the office yesterday to drop off completed [employer] Patient Assistance paperwork.  Patient is due for injections next week.  Sample of Cosentyx given to patient .  LOT: [id number] EXP: 10/2021

## 2020-08-13 ENCOUNTER — Telehealth: Payer: Self-pay

## 2020-08-13 ENCOUNTER — Other Ambulatory Visit: Payer: Self-pay | Admitting: Family Medicine

## 2020-08-13 DIAGNOSIS — Z1231 Encounter for screening mammogram for malignant neoplasm of breast: Secondary | ICD-10-CM

## 2020-08-13 NOTE — Telephone Encounter (Signed)
Pt calling to let Estill Bamberg know her consentyx will be shipped her in 2-3 days so she will call back on Thursday

## 2020-08-16 ENCOUNTER — Other Ambulatory Visit: Payer: Self-pay

## 2020-08-16 ENCOUNTER — Ambulatory Visit
Admission: RE | Admit: 2020-08-16 | Discharge: 2020-08-16 | Disposition: A | Discharge status: Home / Self Care | Payer: Medicare Other | Source: Ambulatory Visit | Attending: Family Medicine | Admitting: Family Medicine

## 2020-08-16 DIAGNOSIS — Z1231 Encounter for screening mammogram for malignant neoplasm of breast: Secondary | ICD-10-CM | POA: Diagnosis present

## 2020-09-11 ENCOUNTER — Ambulatory Visit (INDEPENDENT_AMBULATORY_CARE_PROVIDER_SITE_OTHER): Payer: Medicare Other | Admitting: Dermatology

## 2020-09-11 ENCOUNTER — Other Ambulatory Visit: Payer: Self-pay

## 2020-09-11 DIAGNOSIS — L409 Psoriasis, unspecified: Secondary | ICD-10-CM | POA: Diagnosis not present

## 2020-09-11 NOTE — Patient Instructions (Signed)

## 2020-09-11 NOTE — Progress Notes (Signed)
   Follow-Up Visit   Subjective  Diane Gardner is a 70 y.o. female who presents for the following: Psoriasis (Scalp, ears, Cosentyx sq injections q 4 wks, Mometasone cr 3x/wk). No side effects noted.  No infections.  Joint pain and stiffness in fingers is much Gardner.  Ears aren't as itchy when she uses the cream 3x/week.  Scalp is clear.   The following portions of the chart were reviewed this encounter and updated as appropriate:       Review of Systems:  No other skin or systemic complaints except as noted in HPI or Assessment and Plan.  Objective  Well appearing patient in no apparent distress; mood and affect are within normal limits.  A focused examination was performed including scalp, ears, face. Relevant physical exam findings are noted in the Assessment and Plan.  Objective  Scalp, ears: Mild erythema of the ears, scalp clear, joint pain and stiffness much improved per pt   Assessment & Plan  Psoriasis Scalp, ears  With Psoriatic Arthritis Psoriasis is a chronic non-curable, but treatable genetic/hereditary disease that may have other systemic features affecting other organ systems such as joints (Psoriatic Arthritis). It is associated with an increased risk of inflammatory bowel disease, heart disease, non-alcoholic fatty liver disease, and depression.    Improved on Cosentyx, no side effects, pt is pleased with results Skin improved, Joint stiffness/pain improved  Pending labs, cont Cosentyx 300mg  sq injections q 4 wks Cont Mometasone cr 3-4d/wk prn flares ears  Reviewed risks of biologics including immunosuppression, infections, injection site reaction, and failure to improve condition. Goal is control of skin condition, not cure.  Some older biologics such as Humira and Enbrel may slightly increase risk of malignancy and may worsen congestive heart failure. The use of biologics requires long term medication management, including periodic office visits and  monitoring of blood work.     Comprehensive metabolic panel - Scalp, ears  CBC with Differential/Platelet - Scalp, ears  QuantiFERON-TB Gold Plus - Scalp, ears  Other Related Medications mometasone (ELOCON) 0.1 % cream  Return in about 6 months (around 03/13/2021) for Psoriasis f/u.  I, Othelia Pulling, RMA, am acting as scribe for Brendolyn Patty, MD .  Documentation: I have reviewed the above documentation for accuracy and completeness, and I agree with the above.  Brendolyn Patty MD

## 2021-03-19 ENCOUNTER — Ambulatory Visit (INDEPENDENT_AMBULATORY_CARE_PROVIDER_SITE_OTHER): Payer: Medicare Other | Admitting: Dermatology

## 2021-03-19 ENCOUNTER — Other Ambulatory Visit: Payer: Self-pay

## 2021-03-19 DIAGNOSIS — L409 Psoriasis, unspecified: Secondary | ICD-10-CM

## 2021-03-19 DIAGNOSIS — Z79899 Other long term (current) drug therapy: Secondary | ICD-10-CM

## 2021-03-19 NOTE — Patient Instructions (Signed)

## 2021-03-19 NOTE — Progress Notes (Signed)
   Follow-Up Visit   Subjective  Diane Gardner is a 70 y.o. female who presents for the following: Follow-up (Patient here today for follow up on psoriasis at scalp and ears. Patient use cosentyx and mometasone, dermotic oil at ear when needed.).  No side effects noted.  Increased Joint pain in hands and fingers are stiff and achy patient believes related to weather changing   The following portions of the chart were reviewed this encounter and updated as appropriate:      Review of Systems: No other skin or systemic complaints except as noted in HPI or Assessment and Plan.   Objective  Well appearing patient in no apparent distress; mood and affect are within normal limits.  A focused examination was performed including scalp, ears, face. hands. Relevant physical exam findings are noted in the Assessment and Plan.  Scalp Mild Erythema/scale at right concha and left ear canal, scalp clear  Assessment & Plan  Psoriasis Scalp  Skin well-controlled on Cosentyx, recent return joint symptoms with weather change. Will monitor.  Prior labs reviewed, yearly labs due next visit.  Psoriasis - severe on systemic "biologic" treatment injections.  Psoriasis is a chronic non-curable, but treatable genetic/hereditary disease that may have other systemic features affecting other organ systems such as joints (Psoriatic Arthritis).  It is linked with heart disease, inflammatory bowel disease, non-alcoholic fatty liver disease, and depression. Significant skin psoriasis and/or psoriatic arthritis may have significant symptoms and affects activities of daily activity and often benefits from systemic "biologic" injection treatments.  These "biologic" treatments have some potential side effects including immunosuppression and require pre-treatment laboratory screening and periodic laboratory monitoring and periodic in person evaluation and monitoring by the attending dermatologist physician (long term  medication management).    Continue mometasone 0.1 % cream prn Continue Cosentyx 300 mg SQ q4wks Continue dermotic oil qd/bid to aas ears as directed  Reviewed risks of biologics including immunosuppression, infections, injection site reaction, and failure to improve condition. Goal is control of skin condition, not cure.  Some older biologics such as Humira and Enbrel may slightly increase risk of malignancy and may worsen congestive heart failure. The use of biologics requires long term medication management, including periodic office visits and monitoring of blood work.   Related Medications mometasone (ELOCON) 0.1 % cream Apply to affected areas ears twice a day as needed for itch/scale.  Return for 6 month psoriasis follow up. I, Ruthell Rummage, CMA, am acting as scribe for Brendolyn Patty, MD.  Documentation: I have reviewed the above documentation for accuracy and completeness, and I agree with the above.  Brendolyn Patty MD

## 2021-04-22 ENCOUNTER — Telehealth: Payer: Self-pay

## 2021-04-22 NOTE — Telephone Encounter (Signed)
Pt called to discussed we do not have her Consentyx rx in the office, if she need a sample she can come back to pick up a sample

## 2021-04-24 ENCOUNTER — Other Ambulatory Visit: Payer: Self-pay

## 2021-04-24 MED ORDER — COSENTYX SENSOREADY (300 MG) 150 MG/ML ~~LOC~~ SOAJ: 300.0000 mg | SUBCUTANEOUS | 5 refills | Status: DC

## 2021-04-24 NOTE — Progress Notes (Signed)
Cosentyx RFs for patient assistance.

## 2021-04-29 ENCOUNTER — Other Ambulatory Visit: Payer: Self-pay

## 2021-04-29 MED ORDER — COSENTYX SENSOREADY (300 MG) 150 MG/ML ~~LOC~~ SOAJ: 300.0000 mg | SUBCUTANEOUS | 5 refills | Status: DC

## 2021-04-29 NOTE — Progress Notes (Signed)
Rx sent to correct pharmacy.

## 2021-06-18 ENCOUNTER — Ambulatory Visit (INDEPENDENT_AMBULATORY_CARE_PROVIDER_SITE_OTHER): Payer: Medicare Other | Admitting: Dermatology

## 2021-06-18 ENCOUNTER — Other Ambulatory Visit: Payer: Self-pay

## 2021-06-18 DIAGNOSIS — L02212 Cutaneous abscess of back [any part, except buttock]: Secondary | ICD-10-CM

## 2021-06-18 DIAGNOSIS — L723 Sebaceous cyst: Secondary | ICD-10-CM

## 2021-06-18 MED ORDER — DOXYCYCLINE HYCLATE 100 MG PO CAPS
100.0000 mg | ORAL_CAPSULE | Freq: Two times a day (BID) | ORAL | 0 refills | Status: AC
Start: 2021-06-18 — End: 2021-09-16

## 2021-06-18 NOTE — Progress Notes (Signed)
? ?  Follow-Up Visit ?  ?Subjective  ?Diane Gardner is a 71 y.o. female who presents for the following: Cyst (L lower back, present for years. I & D several years ago. Tender and draining x 1 week.).  She would like it removed. ? ? ?The following portions of the chart were reviewed this encounter and updated as appropriate:  ?  ?  ? ?Review of Systems:  No other skin or systemic complaints except as noted in HPI or Assessment and Plan. ? ?Objective  ?Well appearing patient in no apparent distress; mood and affect are within normal limits. ? ?A focused examination was performed including back. Relevant physical exam findings are noted in the Assessment and Plan. ? ?Left Mid Lower Back ?2.5 cm flucuant sq nodule with overlying erythema ? ? ? ?Assessment & Plan  ?Inflamed epidermoid cyst of skin ?Left Mid Lower Back ? ?With Abscess ? ?I&D today ? ?Start doxycycline '100mg'$  take 1 po BID with food x 2 weeks, then decrease to 1 po QD with food until finished dsp #60 0Rf. ? ?Start Mupirocin ointment to AA daily with bandage change. Pt has at home. ? ?Recommend surgical excision in ~8 wks once inflamed cyst has healed ? ?Doxycycline should be taken with food to prevent nausea. Do not lay down for 30 minutes after taking. Be cautious with sun exposure and use good sun protection while on this medication. Pregnant women should not take this medication.  ? ? ? ?Incision and Drainage - Left Mid Lower Back ?Location: left mid lower back ? ?Informed Consent: Discussed risks (permanent scarring, light or dark discoloration, infection, pain, bleeding, bruising, redness, damage to adjacent structures, and recurrence of the lesion) and benefits of the procedure, as well as the alternatives.  Informed consent was obtained. ? ?Preparation: The area was prepped with alcohol. ? ?Anesthesia: Lidocaine 1% with epinephrine ? ?Procedure Details: An incision was made overlying the lesion. The lesion drained pus and blood.  ?A small amount  of fluid was drained.    ?Antibiotic ointment and a sterile pressure dressing were applied. The patient tolerated procedure well. ? ?Total number of lesions drained: 1 ? ?Plan: The patient was instructed on post-op care. Recommend OTC analgesia as needed for pain. ? ? ?doxycycline (VIBRAMYCIN) 100 MG capsule - Left Mid Lower Back ?Take 1 capsule (100 mg total) by mouth 2 (two) times daily. Take with food. ? ? ?Return if symptoms worsen or fail to improve. ? ?I, Jamesetta Orleans, CMA, am acting as scribe for Brendolyn Patty, MD . ? ?Documentation: I have reviewed the above documentation for accuracy and completeness, and I agree with the above. ? ?Brendolyn Patty MD  ? ?

## 2021-06-18 NOTE — Patient Instructions (Addendum)
Start Doxycycline '100mg'$  - take 1 capsule by mouth twice daily for 2 weeks, then decrease to 1 capsule by mouth daily until finished. Take with food. ? ?Doxycycline should be taken with food to prevent nausea. Do not lay down for 30 minutes after taking. Be cautious with sun exposure and use good sun protection while on this medication. Pregnant women should not take this medication.  ? ?Mupirocin 2% Ointment - apply to affected area once daily with bandage change. ? ?If You Need Anything After Your Visit ? ?If you have any questions or concerns for your doctor, please call our main line at [phone] and press option 4 to reach your doctor's medical assistant. If no one answers, please leave a voicemail as directed and we will return your call as soon as possible. Messages left after 4 pm will be answered the following business day.  ? ?You may also send [place] a message via MyChart. We typically respond to MyChart messages within 1-2 business days. ? ?For prescription refills, please ask your pharmacy to contact our office. Our fax number is [phone]. ? ?If you have an urgent issue when the clinic is closed that cannot wait until the next business day, you can page your doctor at the number below.   ? ?Please note that while we do our best to be available for urgent issues outside of office hours, we are not available 24/7.  ? ?If you have an urgent issue and are unable to reach [place], you may choose to seek medical care at your doctor's office, retail clinic, urgent care center, or emergency room. ? ?If you have a medical emergency, please immediately call 911 or go to the emergency department. ? ?Pager Numbers ? ?- Dr. Nehemiah Massed: [phone] ? ?- Dr. Laurence Ferrari: [phone] ? ?- Dr. Nicole Kindred: [phone] ? ?In the event of inclement weather, please call our main line at [phone] for an update on the status of any delays or closures. ? ?Dermatology Medication Tips: ?Please keep the boxes that topical medications  come in in order to help keep track of the instructions about where and how to use these. Pharmacies typically print the medication instructions only on the boxes and not directly on the medication tubes.  ? ?If your medication is too expensive, please contact our office at [phone] option 4 or send [place] a message through [place].  ? ?We are unable to tell what your co-pay for medications will be in advance as this is different depending on your insurance coverage. However, we may be able to find a substitute medication at lower cost or fill out paperwork to get insurance to cover a needed medication.  ? ?If a prior authorization is required to get your medication covered by your insurance company, please allow [place] 1-2 business days to complete this process. ? ?Drug prices often vary depending on where the prescription is filled and some pharmacies may offer cheaper prices. ? ?The website www.goodrx.com contains coupons for medications through different pharmacies. The prices here do not account for what the cost may be with help from insurance (it may be cheaper with your insurance), but the website can give you the price if you did not use any insurance.  ?- You can print the associated coupon and take it with your prescription to the pharmacy.  ?- You may also stop by our office during regular business hours and pick up a GoodRx coupon card.  ?- If you need your prescription sent electronically to a different  pharmacy, notify our office through [facility] or by phone at [phone] option 4. ? ? ? ? ?Si Usted Necesita Algo Despu?s de Su Visita ? ?Tambi?n puede enviarnos un mensaje a trav?s de MyChart. Por lo general respondemos a los mensajes de MyChart en el transcurso de 1 a 2 d?as h?biles. ? ?Para renovar recetas, por favor pida a su farmacia que se ponga en contacto con nuestra oficina. Nuestro n?mero de fax es el [phone]. ? ?Si tiene un asunto urgente cuando la cl?nica est? cerrada y que no  puede esperar hasta el siguiente d?a h?bil, puede llamar/localizar a su doctor(a) al n?mero que aparece a continuaci?n.  ? ?Por favor, tenga en cuenta que aunque hacemos todo lo posible para estar disponibles para asuntos urgentes fuera del horario de oficina, no estamos disponibles las 24 horas del d?a, los 7 d?as de la semana.  ? ?Si tiene un problema urgente y no puede comunicarse con nosotros, puede optar por buscar atenci?n m?dica  en el consultorio de su doctor(a), en una cl?nica privada, en un centro de atenci?n urgente o en una sala de emergencias. ? ?Si tiene Engineer, maintenance (IT) m?dica, por favor llame inmediatamente al 911 o vaya a la sala de emergencias. ? ?N?meros de b?per ? ?- Dr. Nehemiah Massed: [phone] ? ?- Dra. Moye: [phone] ? ?- Dra. Nicole Kindred: [phone] ? ?En caso de inclemencias del tiempo, por favor llame a nuestra l?nea principal al [phone] para una actualizaci?n sobre el estado de cualquier retraso o cierre. ? ?Consejos para la medicaci?n en dermatolog?a: ?Por favor, guarde las cajas en las que vienen los medicamentos de uso t?pico para ayudarle a seguir las instrucciones sobre d?nde y c?mo usarlos. Las farmacias generalmente imprimen las instrucciones del medicamento s?lo en las cajas y no directamente en los tubos del [place].  ? ?Si su medicamento es muy caro, por favor, p?ngase en contacto con Zigmund Daniel llamando al [phone] y presione la opci?n 4 o env?enos un mensaje a trav?s de MyChart.  ? ?No podemos decirle cu?l ser? su copago por los medicamentos por adelantado ya que esto es diferente dependiendo de la cobertura de su seguro. Sin embargo, es posible que podamos encontrar un medicamento sustituto a Electrical engineer un formulario para que el seguro cubra el medicamento que se considera necesario.  ? ?Si se requiere Ardelia Mems autorizaci?n previa para que su compa??a de seguros [place] su medicamento, por favor perm?tanos de 1 a 2 d?as h?biles para completar este  proceso. ? ?Los precios de los medicamentos var?an con frecuencia dependiendo del Environmental consultant de d?nde se surte la receta y alguna farmacias pueden ofrecer precios m?s baratos. ? ?El sitio web www.goodrx.com tiene cupones para medicamentos de Airline pilot. Los precios aqu? no tienen en cuenta lo que podr?a costar con la ayuda del seguro (puede ser m?s barato con su seguro), pero el sitio web puede darle el precio si no utiliz? ning?n seguro.  ?- Puede imprimir el cup?n correspondiente y llevarlo con su receta a la farmacia.  ?- Tambi?n puede pasar por nuestra oficina durante el horario de atenci?n regular y recoger una tarjeta de cupones de GoodRx.  ?- Si necesita que su receta se env?e electr?nicamente a Chiropodist, informe a nuestra oficina a trav?s de MyChart de [place] o por tel?fono llamando al [phone] y presione la opci?n 4. ? ?

## 2021-08-12 ENCOUNTER — Ambulatory Visit (INDEPENDENT_AMBULATORY_CARE_PROVIDER_SITE_OTHER): Payer: Medicare Other | Admitting: Dermatology

## 2021-08-12 DIAGNOSIS — L720 Epidermal cyst: Secondary | ICD-10-CM

## 2021-08-12 DIAGNOSIS — D492 Neoplasm of unspecified behavior of bone, soft tissue, and skin: Secondary | ICD-10-CM

## 2021-08-12 NOTE — Patient Instructions (Signed)
Wound Care Instructions ? ?On the day following your surgery, you should begin doing daily dressing changes: ?Remove the old dressing and discard it. ?Cleanse the wound gently with tap water. This may be done in the shower or by placing a wet gauze pad directly on the wound and letting it soak for several minutes. ?It is important to gently remove any dried blood from the wound in order to encourage healing. This may be done by gently rolling a moistened Q-tip on the dried blood. Do not pick at the wound. ?If the wound should start to bleed, continue cleaning the wound, then place a moist gauze pad on the wound and hold pressure for a few minutes.  ?Make sure you then dry the skin surrounding the wound completely or the tape will not stick to the skin. Do not use cotton balls on the wound. ?After the wound is clean and dry, apply the ointment gently with a Q-tip. ?Cut a non-stick pad to fit the size of the wound. Lay the pad flush to the wound. If the wound is draining, you may want to reinforce it with a small amount of gauze on top of the non-stick pad for a little added compression to the area. ?Use the tape to seal the area completely. ?Select from the following with respect to your individual situation: ?If your wound has been stitched closed: continue the above steps 1-8 at least daily until your sutures are removed. ?If your wound has been left open to heal: continue steps 1-8 at least daily for the first 3-4 weeks. ?We would like for you to take a few extra precautions for at least the next week. ?Sleep with your head elevated on pillows if our wound is on your head. ?Do not bend over or lift heavy items to reduce the chance of elevated blood pressure to the wound ?Do not participate in particularly strenuous activities. ? ? ?Below is a list of dressing supplies you might need.  ?Cotton-tipped applicators - Q-tips ?Gauze pads (2x2 and/or 4x4) - All-Purpose Sponges ?Non-stick dressing material - Telfa ?Tape -  Paper or Hypafix ?New and clean tube of petroleum jelly - Vaseline  ? ? ?Comments on Post-Operative Period ?Slight swelling and redness often appear around the wound. This is normal and will disappear within several days following the surgery. ?The healing wound will drain a brownish-red-yellow discharge during healing. This is a normal phase of wound healing. As the wound begins to heal, the drainage may increase in amount. Again, this drainage is normal. ?Notify us if the drainage becomes persistently bloody, excessively swollen, or intensely painful or develops a foul odor or red streaks.  ?If you should experience mild discomfort during the healing phase, you may take an aspirin-free medication such as Tylenol (acetaminophen). Notify us if the discomfort is severe or persistent. Avoid alcoholic beverages when taking pain medicine. ? ?In Case of Wound Hemorrhage ?A wound hemorrhage is when the bandage suddenly becomes soaked with bright red blood and flows profusely. If this happens, sit down or lie down with your head elevated. If the wound has a dressing on it, do not remove the dressing. Apply pressure to the existing gauze. If the wound is not covered, use a gauze pad to apply pressure and continue applying the pressure for 20 minutes without peeking. DO NOT COVER THE WOUND WITH A LARGE TOWEL OR WASH CLOTH. Release your hand from the wound site but do not remove the dressing. If the bleeding has stopped,   gently clean around the wound. Leave the dressing in place for 24 hours if possible. This wait time allows the blood vessels to close off so that you do not spark a new round of bleeding by disrupting the newly clotted blood vessels with an immediate dressing change. If the bleeding does not subside, continue to hold pressure. If matters are out of your control, contact an After Hours clinic or go to the Emergency Room. ? ?If You Need Anything After Your Visit ? ?If you have any questions or concerns for your  doctor, please call our main line at [phone] and press option 4 to reach your doctor's medical assistant. If no one answers, please leave a voicemail as directed and we will return your call as soon as possible. Messages left after 4 pm will be answered the following business day.  ? ?You may also send us a message via MyChart. We typically respond to MyChart messages within 1-2 business days. ? ?For prescription refills, please ask your pharmacy to contact our office. Our fax number is [phone]. ? ?If you have an urgent issue when the clinic is closed that cannot wait until the next business day, you can page your doctor at the number below.   ? ?Please note that while we do our best to be available for urgent issues outside of office hours, we are not available 24/7.  ? ?If you have an urgent issue and are unable to reach us, you may choose to seek medical care at your doctor's office, retail clinic, urgent care center, or emergency room. ? ?If you have a medical emergency, please immediately call 911 or go to the emergency department. ? ?Pager Numbers ? ?- Dr. Kowalski: [phone] ? ?- Dr. Moye: [phone] ? ?- Dr. Stewart: [phone] ? ?In the event of inclement weather, please call our main line at [phone] for an update on the status of any delays or closures. ? ?Dermatology Medication Tips: ?Please keep the boxes that topical medications come in in order to help keep track of the instructions about where and how to use these. Pharmacies typically print the medication instructions only on the boxes and not directly on the medication tubes.  ? ?If your medication is too expensive, please contact our office at [phone] option 4 or send us a message through MyChart.  ? ?We are unable to tell what your co-pay for medications will be in advance as this is different depending on your insurance coverage. However, we may be able to find a substitute medication at lower cost or fill out paperwork  to get insurance to cover a needed medication.  ? ?If a prior authorization is required to get your medication covered by your insurance company, please allow us 1-2 business days to complete this process. ? ?Drug prices often vary depending on where the prescription is filled and some pharmacies may offer cheaper prices. ? ?The website www.goodrx.com contains coupons for medications through different pharmacies. The prices here do not account for what the cost may be with help from insurance (it may be cheaper with your insurance), but the website can give you the price if you did not use any insurance.  ?- You can print the associated coupon and take it with your prescription to the pharmacy.  ?- You may also stop by our office during regular business hours and pick up a GoodRx coupon card.  ?- If you need your prescription sent electronically to a different pharmacy, notify our office through    MyChart or by phone at [phone] option 4. ? ? ? ? ?Si Usted Necesita Algo Despu?s de Su Visita ? ?Tambi?n puede enviarnos un mensaje a trav?s de MyChart. Por lo general respondemos a los mensajes de MyChart en el transcurso de 1 a 2 d?as h?biles. ? ?Para renovar recetas, por favor pida a su farmacia que se ponga en contacto con nuestra oficina. Nuestro n?mero de fax es el [phone]. ? ?Si tiene un asunto urgente cuando la cl?nica est? cerrada y que no puede esperar hasta el siguiente d?a h?bil, puede llamar/localizar a su doctor(a) al n?mero que aparece a continuaci?n.  ? ?Por favor, tenga en cuenta que aunque hacemos todo lo posible para estar disponibles para asuntos urgentes fuera del horario de oficina, no estamos disponibles las 24 horas del d?a, los 7 d?as de la semana.  ? ?Si tiene un problema urgente y no puede comunicarse con nosotros, puede optar por buscar atenci?n m?dica  en el consultorio de su doctor(a), en una cl?nica privada, en un centro de atenci?n urgente o en una sala de  emergencias. ? ?Si tiene una emergencia m?dica, por favor llame inmediatamente al 911 o vaya a la sala de emergencias. ? ?N?meros de b?per ? ?- Dr. Kowalski: [phone] ? ?- Dra. Moye: [phone] ? ?- Dra. St

## 2021-08-12 NOTE — Progress Notes (Signed)
? ?  Follow-Up Visit ?  ?Subjective  ?Diane Gardner is a 71 y.o. female who presents for the following: Cyst vs other (L mid lower back, pt presents for excision today). ? ? ?The following portions of the chart were reviewed this encounter and updated as appropriate:  ?  ?  ? ?Review of Systems:  No other skin or systemic complaints except as noted in HPI or Assessment and Plan. ? ?Objective  ?Well appearing patient in no apparent distress; mood and affect are within normal limits. ? ?A focused examination was performed including back. Relevant physical exam findings are noted in the Assessment and Plan. ? ?L mid lower back ?Firm sub q nodule 1.4cm ? ? ? ?Assessment & Plan  ?Neoplasm of skin ?L mid lower back ? ?Skin excision ? ?Lesion length (cm):  1.4 ?Lesion width (cm):  1.4 ?Margin per side (cm):  0.1 ?Total excision diameter (cm):  1.6 ?Informed consent: discussed and consent obtained   ?Timeout: patient name, date of birth, surgical site, and procedure verified   ?Procedure prep:  Patient was prepped and draped in usual sterile fashion ?Prep type:  Povidone-iodine ?Anesthesia: the lesion was anesthetized in a standard fashion   ?Anesthetic:  1% lidocaine w/ epinephrine 1-100,000 buffered w/ 8.4% NaHCO3 (6cc lido w/ epi, 6cc bupivicaine, Total of 12cc) ?Instrument used: #15 blade   ?Hemostasis achieved with: suture and pressure   ?Outcome: patient tolerated procedure well with no complications   ? ?Skin repair ?Complexity:  Intermediate ?Final length (cm):  2.2 ?Informed consent: discussed and consent obtained   ?Timeout: patient name, date of birth, surgical site, and procedure verified   ?Reason for type of repair: reduce tension to allow closure, reduce the risk of dehiscence, infection, and necrosis, reduce subcutaneous dead space and avoid a hematoma, preserve normal anatomical and functional relationships and enhance both functionality and cosmetic results   ?Undermining: edges undermined    ?Subcutaneous layers (deep stitches):  ?Suture size:  3-0 ?Suture type: Vicryl (polyglactin 910)   ?Subcutaneous suture technique: inverted dermal. ?Fine/surface layer approximation (top stitches):  ?Suture size:  3-0 ?Suture type: nylon   ?Stitches: simple interrupted   ?Suture removal (days):  7 ?Hemostasis achieved with: suture ?Outcome: patient tolerated procedure well with no complications   ?Post-procedure details: sterile dressing applied and wound care instructions given   ?Dressing type: pressure dressing (Mupirocin ointment)   ? ?Specimen 1 - Surgical pathology ?Differential Diagnosis: D48.5 Cyst vs other ? ?Check Margins: No ?Firm sub q nodule 1.4cm ? ?Cyst vs other, excised today ? ? ?Return in about 1 week (around 08/19/2021) for suture removal. ? ?I, Sonya Hupman, RMA, am acting as scribe for Brendolyn Patty, MD . ? ?Documentation: I have reviewed the above documentation for accuracy and completeness, and I agree with the above. ? ?Brendolyn Patty MD  ? ?

## 2021-08-13 ENCOUNTER — Telehealth: Payer: Self-pay

## 2021-08-13 NOTE — Telephone Encounter (Signed)
Pt doing well after yesterday's surgery./sh 

## 2021-08-19 ENCOUNTER — Ambulatory Visit (INDEPENDENT_AMBULATORY_CARE_PROVIDER_SITE_OTHER): Payer: Medicare Other | Admitting: Dermatology

## 2021-08-19 DIAGNOSIS — Z4802 Encounter for removal of sutures: Secondary | ICD-10-CM

## 2021-08-19 DIAGNOSIS — L729 Follicular cyst of the skin and subcutaneous tissue, unspecified: Secondary | ICD-10-CM

## 2021-08-19 NOTE — Progress Notes (Signed)
? ?  Follow-Up Visit ?  ?Subjective  ?Diane Gardner is a 71 y.o. female who presents for the following: Follow-up (Patient here today for suture removal at left mid lower back for bx proven cyst. ). ? ? ?The following portions of the chart were reviewed this encounter and updated as appropriate:  ?  ?  ? ?Review of Systems:  No other skin or systemic complaints except as noted in HPI or Assessment and Plan. ? ?Objective  ?Well appearing patient in no apparent distress; mood and affect are within normal limits. ? ?A focused examination was performed including back. Relevant physical exam findings are noted in the Assessment and Plan. ? ? ? ?Assessment & Plan  ? ?Encounter for Removal of Sutures ?- Incision site at the left mid lower back is clean, dry and intact ?- Wound cleansed, sutures removed, wound cleansed and steri strips applied.  ?- Discussed pathology results showing benign cyst  ?- Patient advised to keep steri-strips dry until they fall off. ?- Scars remodel for a full year. ?- Once steri-strips fall off, patient can apply over-the-counter silicone scar cream each night to help with scar remodeling if desired. ?- Patient advised to call with any concerns or if they notice any new or changing lesions. ? ?Return for as scheduled. ? ?Graciella Belton, RMA, am acting as scribe for Brendolyn Patty, MD . ? ?Documentation: I have reviewed the above documentation for accuracy and completeness, and I agree with the above. ? ?Brendolyn Patty MD  ? ?

## 2021-08-19 NOTE — Patient Instructions (Signed)

## 2021-09-24 ENCOUNTER — Ambulatory Visit (INDEPENDENT_AMBULATORY_CARE_PROVIDER_SITE_OTHER): Payer: Medicare Other | Admitting: Dermatology

## 2021-09-24 ENCOUNTER — Encounter: Payer: Self-pay | Admitting: Dermatology

## 2021-09-24 DIAGNOSIS — L409 Psoriasis, unspecified: Secondary | ICD-10-CM

## 2021-09-24 DIAGNOSIS — L4050 Arthropathic psoriasis, unspecified: Secondary | ICD-10-CM

## 2021-09-24 DIAGNOSIS — Z79899 Other long term (current) drug therapy: Secondary | ICD-10-CM | POA: Diagnosis not present

## 2021-09-24 DIAGNOSIS — L905 Scar conditions and fibrosis of skin: Secondary | ICD-10-CM

## 2021-09-24 MED ORDER — MOMETASONE FUROATE 0.1 % EX SOLN: CUTANEOUS | 2 refills | Status: DC

## 2021-09-24 NOTE — Progress Notes (Signed)
Follow-Up Visit   Subjective  Diane Gardner is a 71 y.o. female who presents for the following: Psoriasis (6 month follow up. Scalp, ears, hands. On Cosentyx, Mometasone cream as directed. Psoriasis is controlled well on current treatment regimen. Still having joint pain in hands) and elbows. Has a lot of stiffness.    The following portions of the chart were reviewed this encounter and updated as appropriate:      Review of Systems: No other skin or systemic complaints except as noted in HPI or Assessment and Plan.   Objective  Well appearing patient in no apparent distress; mood and affect are within normal limits.  A focused examination was performed including head, including the scalp, face, neck, nose, ears, eyelids, and lips and hands. Relevant physical exam findings are noted in the Assessment and Plan.  Scalp, ears, hands Mild Erythema/scale at  concha and ear canals, L>R, scalp clear, joint enlargement BL fingers  Left Mid Lower Back Slightly indurated linear lesion with hyperpigmentation   Assessment & Plan  Psoriasis Scalp, ears, hands  With Psoriatic Arthritis Psoriasis controlled well on Cosentyx, joint pain in hands not controlled. Was doing better when first started, but effect is wearing off.  Psoriasis - severe on systemic "biologic" treatment injections.  Psoriasis is a chronic non-curable, but treatable genetic/hereditary disease that may have other systemic features affecting other organ systems such as joints (Psoriatic Arthritis).  It is linked with heart disease, inflammatory bowel disease, non-alcoholic fatty liver disease, and depression. Significant skin psoriasis and/or psoriatic arthritis may have significant symptoms and affects activities of daily activity and often benefits from systemic "biologic" injection treatments.  These "biologic" treatments have some potential side effects including immunosuppression and require pre-treatment laboratory  screening and periodic laboratory monitoring and periodic in person evaluation and monitoring by the attending dermatologist physician (long term medication management).     Start mometasone 0.1 % lotion qd/bid to ears prn itch (changed from cream) Pending labs, continue Cosentyx 300 mg SQ q4wks Pt gets through [employer] free drug program    Reviewed risks of biologics including immunosuppression, infections, injection site reaction, and failure to improve condition. Goal is control of skin condition, not cure.  Some older biologics such as Humira and Enbrel may slightly increase risk of malignancy and may worsen congestive heart failure. The use of biologics requires long term medication management, including periodic office visits and monitoring of blood work.   Discussed switching to Taltz/Skyrizi (or other biologic) to see if helps joint pain. Patient prefers to stay with current treatment regimen for now. Pt unable to tolerate Otezla.  Will send referral to rheumatology in [facility]. Pain and stiffness at hands, fingers, and elbows. On Cosentyx for psoriasis, refer for evaluation and treatment of joint pain/arthritis  Comprehensive metabolic panel - Scalp, ears, hands  CBC with Differential/Platelet - Scalp, ears, hands  QuantiFERON-TB Gold Plus - Scalp, ears, hands  mometasone (ELOCON) 0.1 % lotion - Scalp, ears, hands Apply qd-bid to ears as needed for itching and scale  Related Procedures Ambulatory referral to Rheumatology  High risk medication use  Related Procedures Comprehensive metabolic panel CBC with Differential/Platelet QuantiFERON-TB Gold Plus  Scar Left Mid Lower Back  Secondary to cyst excision. Well-healed. Observe   Return in about 6 months (around 03/26/2022) for Psoriasis Follow Up.  I, Emelia Salisbury, CMA, am acting as scribe for Brendolyn Patty, MD.  Documentation: I have reviewed the above documentation for accuracy and completeness, and I agree with  the above.  Brendolyn Patty MD

## 2021-09-24 NOTE — Patient Instructions (Addendum)
Continue mometasone 0.1 % lotion as directed to ears Continue Cosentyx 300 mg SQ every 4wks    Reviewed risks of biologics including immunosuppression, infections, injection site reaction, and failure to improve condition. Goal is control of skin condition, not cure.  Some older biologics such as Humira and Enbrel may slightly increase risk of malignancy and may worsen congestive heart failure.  Talz and Cosentyx may cause inflammatory bowel disease to flare. The use of biologics requires long term medication management, including periodic office visits and monitoring of blood work.    Topical steroids (such as triamcinolone, fluocinolone, fluocinonide, mometasone, clobetasol, halobetasol, betamethasone, hydrocortisone) can cause thinning and lightening of the skin if they are used for too long in the same area. Your physician has selected the right strength medicine for your problem and area affected on the body. Please use your medication only as directed by your physician to prevent side effects.     Due to recent changes in healthcare laws, you may see results of your pathology and/or laboratory studies on MyChart before the doctors have had a chance to review them. We understand that in some cases there may be results that are confusing or concerning to you. Please understand that not all results are received at the same time and often the doctors may need to interpret multiple results in order to provide you with the best plan of care or course of treatment. Therefore, we ask that you please give [place] 2 business days to thoroughly review all your results before contacting the office for clarification. Should we see a critical lab result, you will be contacted sooner.   If You Need Anything After Your Visit  If you have any questions or concerns for your doctor, please call our main line at [phone] and press option 4 to reach your doctor's medical assistant. If no one answers, please leave a  voicemail as directed and we will return your call as soon as possible. Messages left after 4 pm will be answered the following business day.   You may also send [place] a message via [place]. We typically respond to MyChart messages within 1-2 business days.  For prescription refills, please ask your pharmacy to contact our office. Our fax number is [phone].  If you have an urgent issue when the clinic is closed that cannot wait until the next business day, you can page your doctor at the number below.    Please note that while we do our best to be available for urgent issues outside of office hours, we are not available 24/7.   If you have an urgent issue and are unable to reach [place], you may choose to seek medical care at your doctor's office, retail clinic, urgent care center, or emergency room.  If you have a medical emergency, please immediately call 911 or go to the emergency department.  Pager Numbers  - Dr. Nehemiah Massed: [phone]  - Dr. Laurence Ferrari: [phone]  - Dr. Nicole Kindred: [phone]  In the event of inclement weather, please call our main line at [phone] for an update on the status of any delays or closures.  Dermatology Medication Tips: Please keep the boxes that topical medications come in in order to help keep track of the instructions about where and how to use these. Pharmacies typically print the medication instructions only on the boxes and not directly on the medication tubes.   If your medication is too expensive, please contact our office at [phone] option 4 or send [place]  a message through [place].   We are unable to tell what your co-pay for medications will be in advance as this is different depending on your insurance coverage. However, we may be able to find a substitute medication at lower cost or fill out paperwork to get insurance to cover a needed medication.   If a prior authorization is required to get your medication covered by your insurance  company, please allow [place] 1-2 business days to complete this process.  Drug prices often vary depending on where the prescription is filled and some pharmacies may offer cheaper prices.  The website www.goodrx.com contains coupons for medications through different pharmacies. The prices here do not account for what the cost may be with help from insurance (it may be cheaper with your insurance), but the website can give you the price if you did not use any insurance.  - You can print the associated coupon and take it with your prescription to the pharmacy.  - You may also stop by our office during regular business hours and pick up a GoodRx coupon card.  - If you need your prescription sent electronically to a different pharmacy, notify our office through [facility] or by phone at [phone] option 4.     Si Usted Necesita Algo Despus de Su Visita  Tambin puede enviarnos un mensaje a travs de Pharmacist, community. Por lo general respondemos a los mensajes de MyChart en el transcurso de 1 a 2 das hbiles.  Para renovar recetas, por favor pida a su farmacia que se ponga en contacto con nuestra oficina. Harland Dingwall de fax es [place] [phone].  Si tiene un asunto urgente cuando la clnica est cerrada y que no puede esperar hasta el siguiente da hbil, puede llamar/localizar a su doctor(a) al nmero que aparece a continuacin.   Por favor, tenga en cuenta que aunque hacemos todo lo posible para estar disponibles para asuntos urgentes fuera del horario de [place], no estamos disponibles las 24 horas del da, los 7 das de la [place].   Si tiene un problema urgente y no puede comunicarse con nosotros, puede optar por buscar atencin mdica  en el consultorio de su doctor(a), en una clnica privada, en un centro de atencin urgente o en una sala de emergencias.  Si tiene Engineering geologist, por favor llame inmediatamente al 911 o vaya a la sala de emergencias.  Nmeros de bper  - Dr.  Nehemiah Massed: [phone]  - Dra. Moye: [phone]  - Dra. Nicole Kindred: [phone]  En caso de inclemencias del [place], por favor llame a Johnsie Kindred principal al [phone] para una actualizacin sobre el [place] de cualquier retraso o cierre.  Consejos para la medicacin en dermatologa: Por favor, guarde las cajas en las que vienen los medicamentos de uso tpico para ayudarle a seguir las instrucciones sobre dnde y cmo usarlos. Las farmacias generalmente imprimen las instrucciones del medicamento slo en las cajas y no directamente en los tubos del [place].   Si su medicamento es muy caro, por favor, pngase en contacto con Zigmund Daniel llamando al [phone] y presione la opcin 4 o envenos un mensaje a travs de Pharmacist, community.   No podemos decirle cul ser su copago por los medicamentos por adelantado ya que esto es diferente dependiendo de la cobertura de su seguro. Sin embargo, es posible que podamos encontrar un medicamento sustituto a Electrical engineer un formulario para que el seguro cubra el medicamento que se considera necesario.   Si se requiere  una autorizacin previa para que su compaa de seguros [place] su medicamento, por favor permtanos de 1 a 2 das hbiles para completar este proceso.  Los precios de los medicamentos varan con frecuencia dependiendo del Environmental consultant de dnde se surte la receta y alguna farmacias pueden ofrecer precios ms baratos.  El sitio web www.goodrx.com tiene cupones para medicamentos de Airline pilot. Los precios aqu no tienen en cuenta lo que podra costar con la ayuda del seguro (puede ser ms barato con su seguro), pero el sitio web puede darle el precio si no utiliz Research scientist (physical sciences).  - Puede imprimir el cupn correspondiente y llevarlo con su receta a la farmacia.  - Tambin puede pasar por nuestra oficina durante el horario de atencin regular y Charity fundraiser una tarjeta de cupones de GoodRx.  - Si necesita que su receta se enve  electrnicamente a una farmacia diferente, informe a nuestra oficina a travs de MyChart de [place] o por telfono llamando al [phone] y presione la opcin 4.

## 2021-09-27 LAB — CBC WITH DIFFERENTIAL/PLATELET
Basophils Absolute: 0.1 10*3/uL (ref 0.0–0.2)
Basos: 1 %
EOS (ABSOLUTE): 0.1 10*3/uL (ref 0.0–0.4)
Eos: 2 %
Hematocrit: 41.8 % (ref 34.0–46.6)
Hemoglobin: 14.6 g/dL (ref 11.1–15.9)
Immature Grans (Abs): 0 10*3/uL (ref 0.0–0.1)
Immature Granulocytes: 1 %
Lymphocytes Absolute: 2 10*3/uL (ref 0.7–3.1)
Lymphs: 31 %
MCH: 30.4 pg (ref 26.6–33.0)
MCHC: 34.9 g/dL (ref 31.5–35.7)
MCV: 87 fL (ref 79–97)
Monocytes Absolute: 0.6 10*3/uL (ref 0.1–0.9)
Monocytes: 9 %
Neutrophils Absolute: 3.5 10*3/uL (ref 1.4–7.0)
Neutrophils: 56 %
Platelets: 227 10*3/uL (ref 150–450)
RBC: 4.8 x10E6/uL (ref 3.77–5.28)
RDW: 12.2 % (ref 11.7–15.4)
WBC: 6.2 10*3/uL (ref 3.4–10.8)

## 2021-09-27 LAB — COMPREHENSIVE METABOLIC PANEL
ALT: 35 IU/L — ABNORMAL HIGH (ref 0–32)
AST: 28 IU/L (ref 0–40)
Albumin/Globulin Ratio: 1.9 (ref 1.2–2.2)
Albumin: 4.8 g/dL (ref 3.8–4.8)
Alkaline Phosphatase: 77 IU/L (ref 44–121)
BUN/Creatinine Ratio: 17 (ref 12–28)
BUN: 26 mg/dL (ref 8–27)
Bilirubin Total: 0.5 mg/dL (ref 0.0–1.2)
CO2: 22 mmol/L (ref 20–29)
Calcium: 9.8 mg/dL (ref 8.7–10.3)
Chloride: 96 mmol/L (ref 96–106)
Creatinine, Ser: 1.55 mg/dL — ABNORMAL HIGH (ref 0.57–1.00)
Globulin, Total: 2.5 g/dL (ref 1.5–4.5)
Glucose: 295 mg/dL — ABNORMAL HIGH (ref 70–99)
Potassium: 3.7 mmol/L (ref 3.5–5.2)
Sodium: 138 mmol/L (ref 134–144)
Total Protein: 7.3 g/dL (ref 6.0–8.5)
eGFR: 36 mL/min/{1.73_m2} — ABNORMAL LOW (ref >59)

## 2021-09-27 LAB — QUANTIFERON-TB GOLD PLUS
QuantiFERON Mitogen Value: >10 IU/mL
QuantiFERON Nil Value: 0.03 IU/mL
QuantiFERON TB1 Ag Value: 0.05 IU/mL
QuantiFERON TB2 Ag Value: 0.03 IU/mL
QuantiFERON-TB Gold Plus: NEGATIVE

## 2021-09-30 ENCOUNTER — Telehealth: Payer: Self-pay

## 2021-09-30 NOTE — Telephone Encounter (Signed)
Left pt message to call for bx results.  Will route labs to patient's PCP Hendricks Milo, FNP.

## 2021-09-30 NOTE — Telephone Encounter (Signed)
-----   Message from Brendolyn Patty, MD sent at 09/30/2021  9:46 AM EDT ----- Some abnormalities not related to Cosentyx -please forward to PCP, Blood glucose is high at 295 (pt has DM, but needs to address with PCP if sugars are not controlled), Cr is also elevated at 1.55 (indicates renal dysfunction), but this has been documented in the past, Alt (liver enzyme) is slightly elevated.  CBC/diff is normal. TB is negative, Can continue Cosentyx, send in 6 mos rfs.  - please call patient

## 2021-10-02 ENCOUNTER — Telehealth: Payer: Self-pay

## 2021-10-02 NOTE — Telephone Encounter (Signed)
ERROR

## 2021-10-03 ENCOUNTER — Other Ambulatory Visit: Payer: Self-pay

## 2021-10-03 MED ORDER — COSENTYX SENSOREADY (300 MG) 150 MG/ML ~~LOC~~ SOAJ: 300.0000 mg | SUBCUTANEOUS | 5 refills | Status: DC

## 2021-10-03 NOTE — Progress Notes (Signed)
Refill request faxed over from [facility] by [employer] [facility]

## 2021-10-03 NOTE — Telephone Encounter (Signed)
Advised pt of lab  results.  Advised pt that per Dr. Nicole Kindred I did route her labs over to her PCP, Berneice Heinrich, FNP, so PCP can address some of the abnormalities in lab work.  Also advised that refills of Cosentyx were sent in today./sh

## 2021-10-16 ENCOUNTER — Telehealth: Payer: Self-pay

## 2021-10-16 NOTE — Telephone Encounter (Signed)
Pt called to let us know she will stop by the office tomorrow to pick up her Cosentyx

## 2021-10-29 ENCOUNTER — Other Ambulatory Visit: Payer: Self-pay | Admitting: Family Medicine

## 2021-10-29 DIAGNOSIS — Z1231 Encounter for screening mammogram for malignant neoplasm of breast: Secondary | ICD-10-CM

## 2022-01-24 IMAGING — MG MM DIGITAL DIAGNOSTIC UNILAT*R* W/ TOMO W/ CAD
6 series · 6 of 18 positions shown · non-contrast
Comparison: Previous exam(s).

CLINICAL DATA: Patient recalled from screening for right breast
asymmetry.

EXAM:
DIGITAL DIAGNOSTIC RIGHT MAMMOGRAM WITH CAD AND TOMO
ULTRASOUND RIGHT BREAST

[R CC synth-2D (1 of 2)]
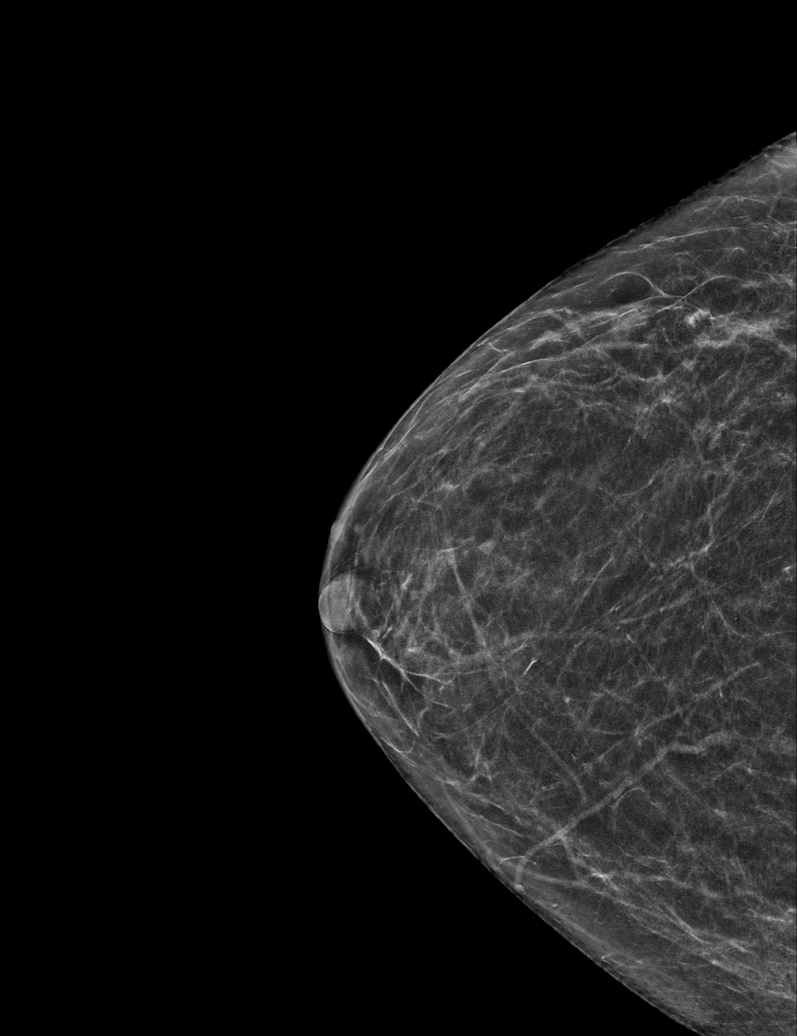

[R ML synth-2D]
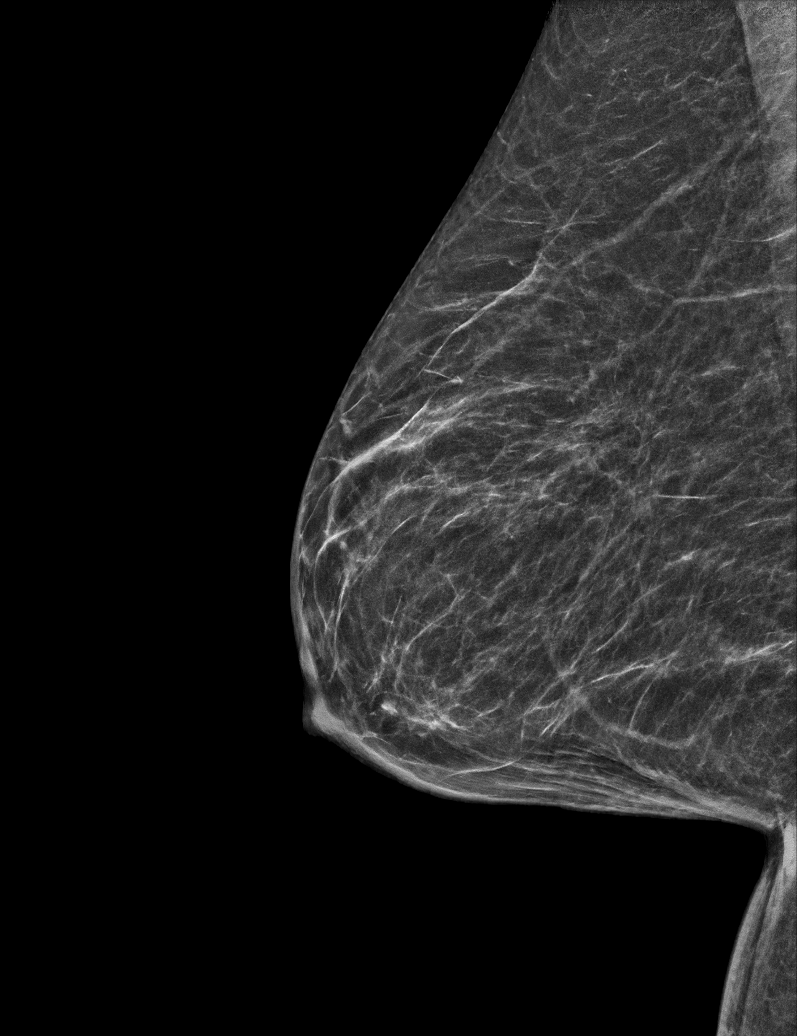

[R CC synth-2D (2 of 2)]
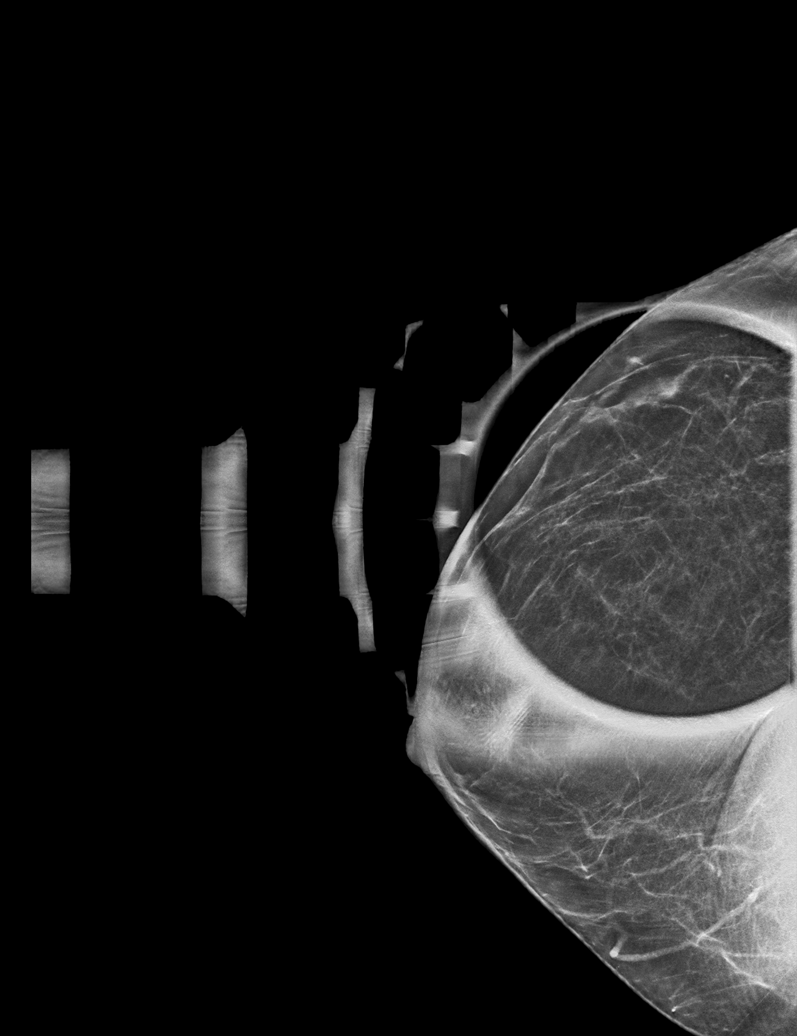

[R ML tomo · tomo slice 27/52.0]
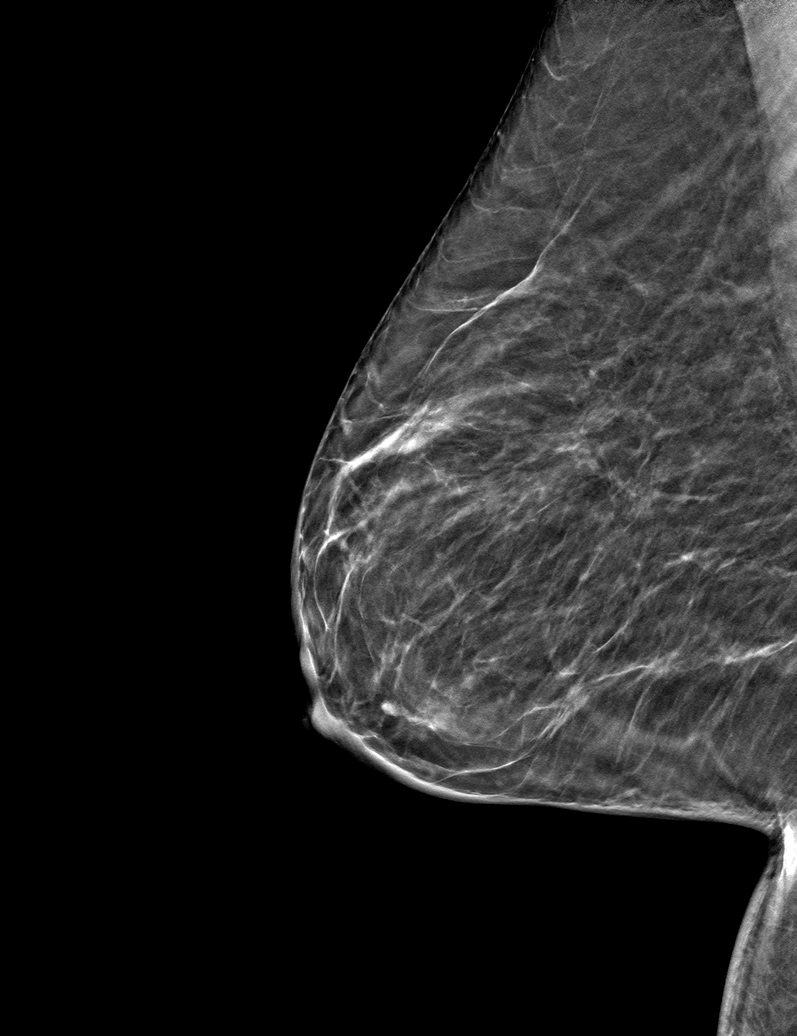

[R CC tomo (1 of 2) · tomo slice 23/45.0]
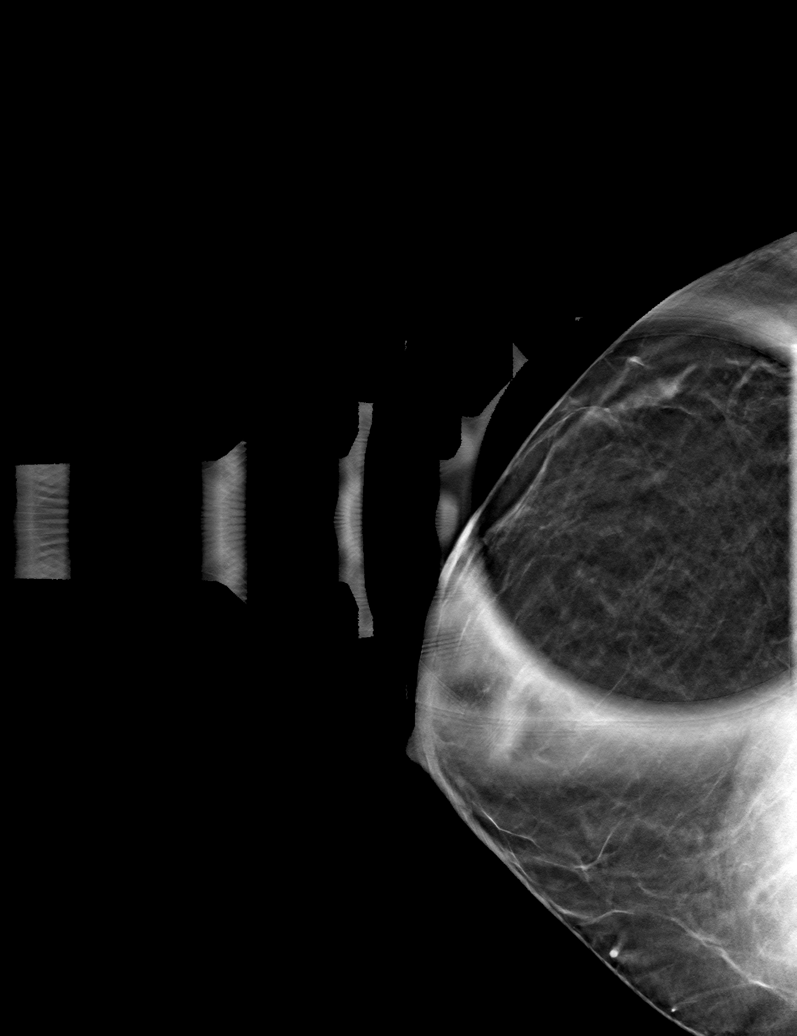

[R CC tomo (2 of 2) · tomo slice 25/50.0]
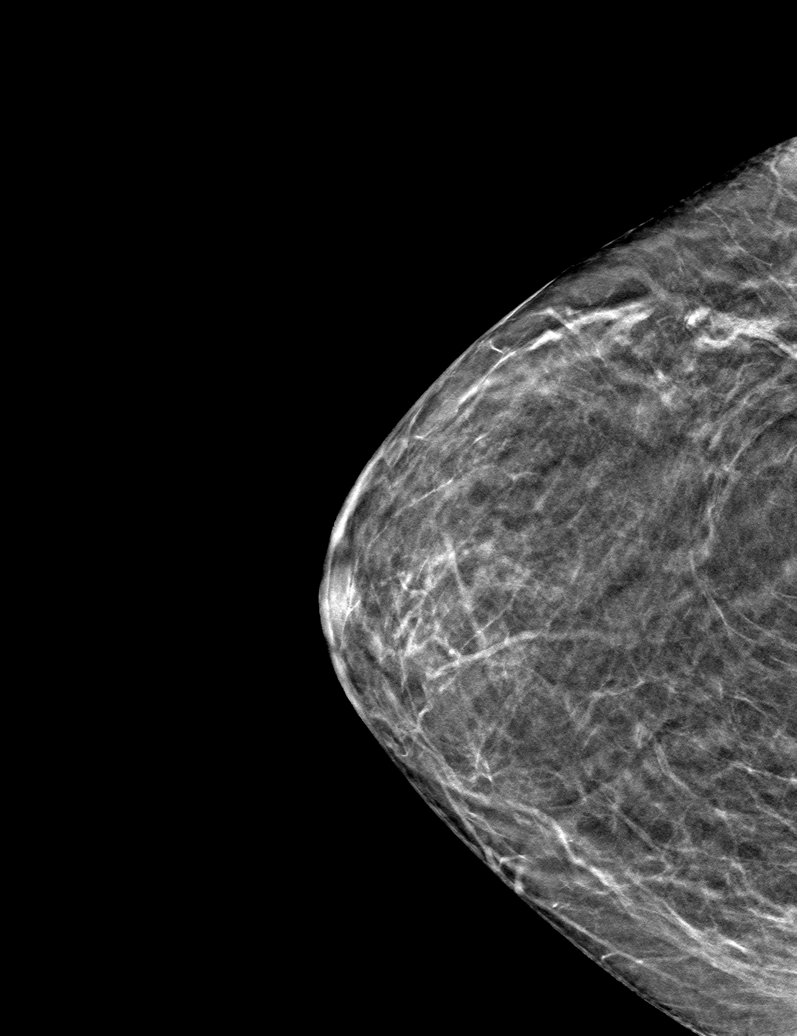

[6 of 18 positions shown; findings below may reference images not displayed]

ACR Breast Density Category b: There are scattered areas of
fibroglandular density.
FINDINGS: Additional imaging demonstrates questioned asymmetry within the
upper-outer right breast to partially resolve, suggestive of dense
fibroglandular tissue.

Mammographic images were processed with CAD.

On physical exam, no discrete mass is palpated within the
upper-outer right breast.

Targeted ultrasound is performed, showing normal tissue without
suspicious mass within the upper-outer right breast.
IMPRESSION: No mammographic evidence for malignancy.

RECOMMENDATION:
Screening mammogram in one year.(Code:[id number])

I have discussed the findings and recommendations with the patient.
If applicable, a reminder letter will be sent to the patient
regarding the next appointment.

BI-RADS CATEGORY  1: Negative.

## 2022-03-31 ENCOUNTER — Ambulatory Visit (INDEPENDENT_AMBULATORY_CARE_PROVIDER_SITE_OTHER): Payer: Medicare Other | Admitting: Dermatology

## 2022-03-31 VITALS — BP 92/56 | HR 95

## 2022-03-31 DIAGNOSIS — L409 Psoriasis, unspecified: Secondary | ICD-10-CM | POA: Diagnosis not present

## 2022-03-31 DIAGNOSIS — Z79899 Other long term (current) drug therapy: Secondary | ICD-10-CM

## 2022-03-31 MED ORDER — COSENTYX SENSOREADY (300 MG) 150 MG/ML ~~LOC~~ SOAJ: 300.0000 mg | SUBCUTANEOUS | 6 refills | Status: DC

## 2022-03-31 NOTE — Progress Notes (Signed)
   Follow-Up Visit   Subjective  Diane Gardner is a 71 y.o. female who presents for the following: Psoriasis (Scalp, ears, hands, 25mf/u, Cosentyx '300mg'$  sq injections q 4 wks, Mometasone lotion prn ears, pt has less joint pain but not resolved). Saw rheumatologist and hand surgeon.  Was told she has Dupuytren's contractures and carpal tunnel syndrome. She thinks her joint pain in her hands is better than it was before starting the Cosentyx.  The following portions of the chart were reviewed this encounter and updated as appropriate:       Review of Systems:  No other skin or systemic complaints except as noted in HPI or Assessment and Plan.  Objective  Well appearing patient in no apparent distress; mood and affect are within normal limits.  A focused examination was performed including scalp, hands, ears. Relevant physical exam findings are noted in the Assessment and Plan.  Scalp, ears, hands Scalp clear, hands clear    Assessment & Plan  Psoriasis Scalp, ears, hands  With Psoriatic Arthritis, with Dupuytren contractures and Bilateral carpal tunnel syndrome  Chronic condition with duration or expected duration over one year. Currently well-controlled on Cosentyx.   Psoriasis - severe on systemic "biologic" treatment injections.  Psoriasis is a chronic non-curable, but treatable genetic/hereditary disease that may have other systemic features affecting other organ systems such as joints (Psoriatic Arthritis).  It is linked with heart disease, inflammatory bowel disease, non-alcoholic fatty liver disease, and depression. Significant skin psoriasis and/or psoriatic arthritis may have significant symptoms and affects activities of daily activity and often benefits from systemic "biologic" injection treatments.  These "biologic" treatments have some potential side effects including immunosuppression and require pre-treatment laboratory screening and periodic laboratory monitoring and  periodic in person evaluation and monitoring by the attending dermatologist physician (long term medication management).   Cont Cosentyx '300mg'$  sq injections q 4 wks Cont Mometasone lotion qd up to 5d/wk aa ears prn flares  Reviewed risks of biologics including immunosuppression, infections, injection site reaction, and failure to improve condition. Goal is control of skin condition, not cure.  Some older biologics such as Humira and Enbrel may slightly increase risk of malignancy and may worsen congestive heart failure.  Talz and Cosentyx may cause inflammatory bowel disease to flare. The use of biologics requires long term medication management, including periodic office visits and monitoring of blood work.   Topical steroids (such as triamcinolone, fluocinolone, fluocinonide, mometasone, clobetasol, halobetasol, betamethasone, hydrocortisone) can cause thinning and lightening of the skin if they are used for too long in the same area. Your physician has selected the right strength medicine for your problem and area affected on the body. Please use your medication only as directed by your physician to prevent side effects.    Secukinumab, 300 MG Dose, (COSENTYX SENSOREADY, 300 MG,) 150 MG/ML SOAJ - Scalp, ears, hands Inject 300 mg into the skin every 28 (twenty-eight) days. For maintenance.  Related Medications mometasone (ELOCON) 0.1 % lotion Apply qd-bid to ears as needed for itching and scale   Return in about 6 months (around 09/30/2022) for Psoriasis f/u.  I, SOthelia Pulling RMA, am acting as scribe for TBrendolyn Patty MD .  Documentation: I have reviewed the above documentation for accuracy and completeness, and I agree with the above.  TBrendolyn PattyMD

## 2022-03-31 NOTE — Patient Instructions (Signed)
Due to recent changes in healthcare laws, you may see results of your pathology and/or laboratory studies on MyChart before the doctors have had a chance to review them. We understand that in some cases there may be results that are confusing or concerning to you. Please understand that not all results are received at the same time and often the doctors may need to interpret multiple results in order to provide you with the best plan of care or course of treatment. Therefore, we ask that you please give us 2 business days to thoroughly review all your results before contacting the office for clarification. Should we see a critical lab result, you will be contacted sooner.   If You Need Anything After Your Visit  If you have any questions or concerns for your doctor, please call our main line at [phone] and press option 4 to reach your doctor's medical assistant. If no one answers, please leave a voicemail as directed and we will return your call as soon as possible. Messages left after 4 pm will be answered the following business day.   You may also send us a message via MyChart. We typically respond to MyChart messages within 1-2 business days.  For prescription refills, please ask your pharmacy to contact our office. Our fax number is [phone].  If you have an urgent issue when the clinic is closed that cannot wait until the next business day, you can page your doctor at the number below.    Please note that while we do our best to be available for urgent issues outside of office hours, we are not available 24/7.   If you have an urgent issue and are unable to reach us, you may choose to seek medical care at your doctor's office, retail clinic, urgent care center, or emergency room.  If you have a medical emergency, please immediately call 911 or go to the emergency department.  Pager Numbers  - Dr. Kowalski: [phone]  - Dr. Moye: [phone]  - Dr. Stewart:  [phone]  In the event of inclement weather, please call our main line at [phone] for an update on the status of any delays or closures.  Dermatology Medication Tips: Please keep the boxes that topical medications come in in order to help keep track of the instructions about where and how to use these. Pharmacies typically print the medication instructions only on the boxes and not directly on the medication tubes.   If your medication is too expensive, please contact our office at [phone] option 4 or send us a message through MyChart.   We are unable to tell what your co-pay for medications will be in advance as this is different depending on your insurance coverage. However, we may be able to find a substitute medication at lower cost or fill out paperwork to get insurance to cover a needed medication.   If a prior authorization is required to get your medication covered by your insurance company, please allow us 1-2 business days to complete this process.  Drug prices often vary depending on where the prescription is filled and some pharmacies may offer cheaper prices.  The website www.goodrx.com contains coupons for medications through different pharmacies. The prices here do not account for what the cost may be with help from insurance (it may be cheaper with your insurance), but the website can give you the price if you did not use any insurance.  - You can print the associated coupon and take it with   your prescription to the pharmacy.  - You may also stop by our office during regular business hours and pick up a GoodRx coupon card.  - If you need your prescription sent electronically to a different pharmacy, notify our office through [place] MyChart or by phone at [phone] option 4.     Si Usted Necesita Algo Despus de Su Visita  Tambin puede enviarnos un mensaje a travs de MyChart. Por lo general respondemos a los mensajes de MyChart en el transcurso de 1 a 2  das hbiles.  Para renovar recetas, por favor pida a su farmacia que se ponga en contacto con nuestra oficina. Nuestro nmero de fax es el [phone].  Si tiene un asunto urgente cuando la clnica est cerrada y que no puede esperar hasta el siguiente da hbil, puede llamar/localizar a su doctor(a) al nmero que aparece a continuacin.   Por favor, tenga en cuenta que aunque hacemos todo lo posible para estar disponibles para asuntos urgentes fuera del horario de oficina, no estamos disponibles las 24 horas del da, los 7 das de la semana.   Si tiene un problema urgente y no puede comunicarse con nosotros, puede optar por buscar atencin mdica  en el consultorio de su doctor(a), en una clnica privada, en un centro de atencin urgente o en una sala de emergencias.  Si tiene una emergencia mdica, por favor llame inmediatamente al 911 o vaya a la sala de emergencias.  Nmeros de bper  - Dr. Kowalski: [phone]  - Dra. Moye: [phone]  - Dra. Stewart: [phone]  En caso de inclemencias del tiempo, por favor llame a nuestra lnea principal al [phone] para una actualizacin sobre el estado de cualquier retraso o cierre.  Consejos para la medicacin en dermatologa: Por favor, guarde las cajas en las que vienen los medicamentos de uso tpico para ayudarle a seguir las instrucciones sobre dnde y cmo usarlos. Las farmacias generalmente imprimen las instrucciones del medicamento slo en las cajas y no directamente en los tubos del medicamento.   Si su medicamento es muy caro, por favor, pngase en contacto con nuestra oficina llamando al [phone] y presione la opcin 4 o envenos un mensaje a travs de MyChart.   No podemos decirle cul ser su copago por los medicamentos por adelantado ya que esto es diferente dependiendo de la cobertura de su seguro. Sin embargo, es posible que podamos encontrar un medicamento sustituto a menor costo o llenar un formulario para que el  seguro cubra el medicamento que se considera necesario.   Si se requiere una autorizacin previa para que su compaa de seguros cubra su medicamento, por favor permtanos de 1 a 2 das hbiles para completar este proceso.  Los precios de los medicamentos varan con frecuencia dependiendo del lugar de dnde se surte la receta y alguna farmacias pueden ofrecer precios ms baratos.  El sitio web www.goodrx.com tiene cupones para medicamentos de diferentes farmacias. Los precios aqu no tienen en cuenta lo que podra costar con la ayuda del seguro (puede ser ms barato con su seguro), pero el sitio web puede darle el precio si no utiliz ningn seguro.  - Puede imprimir el cupn correspondiente y llevarlo con su receta a la farmacia.  - Tambin puede pasar por nuestra oficina durante el horario de atencin regular y recoger una tarjeta de cupones de GoodRx.  - Si necesita que su receta se enve electrnicamente a una farmacia diferente, informe a nuestra oficina a travs de MyChart de [place]   o por telfono llamando al [phone] y presione la opcin 4.  

## 2022-09-30 ENCOUNTER — Ambulatory Visit: Payer: 59 | Admitting: Dermatology

## 2022-12-21 ENCOUNTER — Ambulatory Visit
Admission: EM | Admit: 2022-12-21 | Discharge: 2022-12-21 | Disposition: A | Discharge status: Home / Self Care | Payer: Medicare HMO | Attending: Emergency Medicine | Admitting: Emergency Medicine

## 2022-12-21 ENCOUNTER — Encounter: Payer: Self-pay | Admitting: Emergency Medicine

## 2022-12-21 DIAGNOSIS — Z1152 Encounter for screening for COVID-19: Secondary | ICD-10-CM | POA: Insufficient documentation

## 2022-12-21 DIAGNOSIS — J069 Acute upper respiratory infection, unspecified: Secondary | ICD-10-CM

## 2022-12-21 DIAGNOSIS — R059 Cough, unspecified: Secondary | ICD-10-CM | POA: Insufficient documentation

## 2022-12-21 LAB — SARS CORONAVIRUS 2 BY RT PCR: SARS Coronavirus 2 by RT PCR: NEGATIVE

## 2022-12-21 MED ORDER — BENZONATATE 100 MG PO CAPS: 200.0000 mg | ORAL_CAPSULE | Freq: Three times a day (TID) | ORAL | 0 refills | Status: AC

## 2022-12-21 MED ORDER — IPRATROPIUM BROMIDE 0.06 % NA SOLN: 2.0000 | Freq: Four times a day (QID) | NASAL | 12 refills | Status: AC

## 2022-12-21 NOTE — ED Triage Notes (Signed)
Patient c/o cough, congestion and headaches that stated on Friday.  Patient denies fevers.

## 2022-12-21 NOTE — ED Provider Notes (Signed)
[facility]    CSN: [id number] Arrival date & time: 12/21/22  5277      History   Chief Complaint Chief Complaint  Patient presents with   Cough    Room 6    HPI Diane Gardner is a 72 y.o. female.   HPI  72 year old female with a past medical history significant for chronic A-fib, CHF, colon cancer, cardiomegaly, depression, anxiety, MI, psoriasis with psoriatic arthritis, hyperlipidemia, diabetes, and hepatitis C presents for evaluation of 2 days worth of headache, nasal congestion with nasal discharge, nonproductive cough, and nausea which began this morning.  She has not measured a fever at home and she denies sore throat, ear pain, shortness breath, wheezing, vomiting, diarrhea, known sick contacts or recent travel.  Past Medical History:  Diagnosis Date   Anxiety    Cardiomegaly    CHF (congestive heart failure) (HCC)    Chronic a-fib (HCC)    s/p ablation x3   Colon cancer (HCC) 2002   Colon cancer (HCC)    COVID-19 03/20/2019   Depression    Diabetes mellitus, type 2 (HCC)    Hepatitis C 2002   Treated and cleared   Hyperlipidemia    Myocardial infarction [facility]) 2008   Personal history of chemotherapy    Psoriasis    Psoriatic arthritis (HCC)    Vertigo 2018    Patient Active Problem List   Diagnosis Date Noted   Psoriasis 06/25/2019    Past Surgical History:  Procedure Laterality Date   ATRIAL FIBRILLATION ABLATION     2014, 04/2017, 10/2018  [facility]   CATARACT EXTRACTION W/PHACO Right 08/29/2019   Procedure: CATARACT EXTRACTION PHACO AND INTRAOCULAR LENS PLACEMENT (IOC) RIGHT DIABETIC 5.93  00:41.7;  Surgeon: Nevada Crane, MD;  Location: [facility];  Service: Ophthalmology;  Laterality: Right;  Diabetic - oral meds   CATARACT EXTRACTION W/PHACO Left 09/19/2019   Procedure: CATARACT EXTRACTION PHACO AND INTRAOCULAR LENS PLACEMENT (IOC) LEFT DIABETIC 6.45  00:42.3;  Surgeon: Nevada Crane, MD;  Location: [facility];   Service: Ophthalmology;  Laterality: Left;  Diabetic - oral meds   CHOLECYSTECTOMY     COLON RESECTION     THYROIDECTOMY      OB History   No obstetric history on file.      Home Medications    Prior to Admission medications   Medication Sig Start Date End Date Taking? Authorizing Provider  benzonatate (TESSALON) 100 MG capsule Take 2 capsules (200 mg total) by mouth every 8 (eight) hours. 12/21/22  Yes Becky Augusta, NP  ipratropium (ATROVENT) 0.06 % nasal spray Place 2 sprays into both nostrils 4 (four) times daily. 12/21/22  Yes Becky Augusta, NP  acetaminophen (TYLENOL) 500 MG tablet Take by mouth. 10/04/18   [provider]  ascorbic acid (VITAMIN C) 250 MG tablet Take 500 mg by mouth daily.    [provider]  buPROPion (WELLBUTRIN XL) 150 MG 24 hr tablet Take by mouth. 11/10/17   [provider]  Cyanocobalamin (VITAMIN B-12) 5000 MCG TBDP Take by mouth daily. Patient not taking: Reported on 03/19/2021    [provider]  digoxin (LANOXIN) 0.125 MG tablet Take by mouth. 08/23/18   [provider]  ECHINACEA PO Take by mouth daily. Echinacea 100 mg + Zinc 10 mg Patient not taking: Reported on 03/19/2021    [provider]  ENTRESTO 49-51 MG Take 1 tablet by mouth 2 (two) times daily. 08/22/20   [provider]  Fluocinolone Acetonide Body 0.01 % OIL Apply topically.    [provider]  FLUoxetine (PROZAC) 20 MG capsule Take 60 mg by mouth every morning. 06/23/19   [provider]  furosemide (LASIX) 40 MG tablet  05/13/19   [provider]  GLUCOSAMINE-CHONDROITIN PO Take by mouth 2 (two) times daily.    [provider]  ketoconazole (NIZORAL) 2 % shampoo Apply 1 application topically 2 (two) times a week.    [provider]  levothyroxine (SYNTHROID) 88 MCG tablet Take by mouth. 07/05/18   [provider]  linagliptin (TRADJENTA) 5 MG TABS tablet Take 5 mg by mouth daily.     [provider]  lisinopril (ZESTRIL) 20 MG tablet Take 20 mg by mouth daily. Patient not taking: Reported on 03/19/2021 02/11/19   [provider]  magnesium gluconate (MAGONATE) 500 MG tablet Take by mouth.    [provider]  mometasone (ELOCON) 0.1 % lotion Apply qd-bid to ears as needed for itching and scale 09/24/21   Willeen Niece, MD  OZEMPIC, 0.25 OR 0.5 MG/DOSE, 2 MG/1.5ML SOPN Inject into the skin. 09/06/20   [provider]  rosuvastatin (CRESTOR) 40 MG tablet Take by mouth. 12/14/18   [provider]  Secukinumab, 300 MG Dose, (COSENTYX SENSOREADY, 300 MG,) 150 MG/ML SOAJ Inject 300 mg into the skin every 28 (twenty-eight) days. For maintenance. 03/31/22   Willeen Niece, MD  spironolactone (ALDACTONE) 25 MG tablet Take by mouth. 01/24/19 01/24/20  [provider]  traZODone (DESYREL) 100 MG tablet Take 100 mg by mouth at bedtime. 06/08/19   [provider]  VITAMIN E PO Take 670 mg by mouth daily.    [provider]  Carlena Hurl 20 MG TABS tablet  06/23/19   [provider]    Family History Family History  Problem Relation Age of Onset   Breast cancer Mother 86    Social History Social History   Tobacco Use   Smoking status: Never   Smokeless tobacco: Never  Vaping Use   Vaping status: Never Used  Substance Use Topics   Alcohol use: Not Currently    Comment: may have 1 drink/year     Allergies   Amiodarone, Penicillins, Sulfa antibiotics, and Tikosyn [dofetilide]   Review of Systems Review of Systems  Constitutional:  Negative for fever.  HENT:  Positive for congestion and rhinorrhea. Negative for ear pain and sore throat.   Respiratory:  Positive for cough. Negative for shortness of breath and wheezing.   Gastrointestinal:  Positive for nausea. Negative for diarrhea and vomiting.  Skin:  Negative for rash.  Neurological:  Positive for headaches.  Hematological: Negative.    Psychiatric/Behavioral: Negative.       Physical Exam Triage Vital Signs ED Triage Vitals  Encounter Vitals Group     BP 12/21/22 0932 119/62     Systolic BP Percentile --      Diastolic BP Percentile --      Pulse Rate 12/21/22 0932 79     Resp 12/21/22 0932 14     Temp 12/21/22 0932 98.9 F (37.2 C)     Temp Source 12/21/22 0932 Oral     SpO2 12/21/22 0932 100 %     Weight 12/21/22 0931 173 lb 15.1 oz (78.9 kg)     Height 12/21/22 0931 5\' 6"  (1.676 m)     Head Circumference --      Peak Flow --      Pain  Score 12/21/22 0931 0     Pain Loc --      Pain Education --      Exclude from Growth Chart --    No data found.  Updated Vital Signs BP 119/62 (BP Location: Left Arm)   Pulse 79   Temp 98.9 F (37.2 C) (Oral)   Resp 14   Ht 5\' 6"  (1.676 m)   Wt 173 lb 15.1 oz (78.9 kg)   SpO2 100%   BMI 28.08 kg/m   Visual Acuity Right Eye Distance:   Left Eye Distance:   Bilateral Distance:    Right Eye Near:   Left Eye Near:    Bilateral Near:     Physical Exam Vitals and nursing note reviewed.  Constitutional:      Appearance: Normal appearance. She is not ill-appearing.  HENT:     Head: Normocephalic and atraumatic.     Right Ear: Tympanic membrane, ear canal and external ear normal. There is no impacted cerumen.     Left Ear: Tympanic membrane, ear canal and external ear normal. There is no impacted cerumen.     Nose: Congestion and rhinorrhea present.     Comments: Nasal mucosa is erythematous and edematous with clear discharge in both nares.    Mouth/Throat:     Mouth: Mucous membranes are moist.     Pharynx: Oropharynx is clear. Posterior oropharyngeal erythema present. No oropharyngeal exudate.     Comments: Mild erythema to the posterior pharynx with clear postnasal drip. Cardiovascular:     Rate and Rhythm: Normal rate and regular rhythm.     Pulses: Normal pulses.     Heart sounds: Normal heart sounds. No murmur heard.    No friction rub. No gallop.   Pulmonary:     Effort: Pulmonary effort is normal.     Breath sounds: Normal breath sounds. No wheezing, rhonchi or rales.  Musculoskeletal:     Cervical back: Normal range of motion and neck supple.  Lymphadenopathy:     Cervical: No cervical adenopathy.  Skin:    General: Skin is warm and dry.     Capillary Refill: Capillary refill takes less than 2 seconds.     Findings: No rash.  Neurological:     General: No focal deficit present.     Mental Status: She is alert and oriented to person, place, and time.      UC Treatments / Results  Labs (all labs ordered are listed, but only abnormal results are displayed) Labs Reviewed  SARS CORONAVIRUS 2 BY RT PCR    EKG   Radiology No results found.  Procedures Procedures (including critical care time)  Medications Ordered in UC Medications - No data to display  Initial Impression / Assessment and Plan / UC Course  I have reviewed the triage vital signs and the nursing notes.  Pertinent labs & imaging results that were available during my care of the patient were reviewed by me and considered in my medical decision making (see chart for details).   Patient is a pleasant, nontoxic-appearing 72 year old female presenting for evaluation of respiratory symptoms as outlined HPI above.  She lives with the nurse who recommended that she come in to be evaluated due to her age and medical history.  She is able to speak in full sentence without dyspnea or tachypnea.  Respiratory rate at triage was 14 with a 100% room air oxygen saturation.  She is afebrile with a oral temp of 98.9.  Her  physical exam does reveal inflamed nasal mucosa with clear rhinorrhea and clear postnasal drip.  Cardiopulmonary exam is benign.  Her roommate wanted her tested for COVID, flu, and RSV.  Given that she has not had a fever and less suspicious of flu.  It is not the typical season for RSV as well so I will not order RSV testing at this time.  I will order a  COVID PCR.  COVID PCR is negative.  I will discharge patient home with a diagnosis of viral URI with a cough.  I will prescribe Atrovent nasal spray and Tessalon Perles to help patient with her cough and congestion symptoms.  She can use over-the-counter cough preparations such as Delsym, Robitussin, or Zarbee's as needed for cough and congestion.   Final Clinical Impressions(s) / UC Diagnoses   Final diagnoses:  Viral URI with cough     Discharge Instructions      Your COVID test today was negative.  I do believe that you have a viral respiratory infection.  Use over-the-counter Tylenol and/or ibuprofen according to package instructions as needed for fever or pain.  Use the Atrovent nasal spray, 2 squirts of each nostril every 6 hours, as needed for nasal congestion and drainage.  Use the Tessalon Perles every to hours a day as needed to help you with cough symptoms.  You may also use over-the-counter Delsym, Robitussin, Zarbee's according to the package instructions as needed for cough and congestion.  Please return for reevaluation for any new or worsening symptoms.     ED Prescriptions     Medication Sig Dispense Auth. Provider   benzonatate (TESSALON) 100 MG capsule Take 2 capsules (200 mg total) by mouth every 8 (eight) hours. 21 capsule Becky Augusta, NP   ipratropium (ATROVENT) 0.06 % nasal spray Place 2 sprays into both nostrils 4 (four) times daily. 15 mL Becky Augusta, NP      PDMP not reviewed this encounter.   Becky Augusta, NP 12/21/22 1010

## 2022-12-21 NOTE — Discharge Instructions (Addendum)
Your COVID test today was negative.  I do believe that you have a viral respiratory infection.  Use over-the-counter Tylenol and/or ibuprofen according to package instructions as needed for fever or pain.  Use the Atrovent nasal spray, 2 squirts of each nostril every 6 hours, as needed for nasal congestion and drainage.  Use the Tessalon Perles every to hours a day as needed to help you with cough symptoms.  You may also use over-the-counter Delsym, Robitussin, Zarbee's according to the package instructions as needed for cough and congestion.  Please return for reevaluation for any new or worsening symptoms.

## 2023-01-22 ENCOUNTER — Other Ambulatory Visit: Payer: Self-pay | Admitting: Family Medicine

## 2023-01-22 DIAGNOSIS — Z780 Asymptomatic menopausal state: Secondary | ICD-10-CM

## 2023-01-22 DIAGNOSIS — Z1231 Encounter for screening mammogram for malignant neoplasm of breast: Secondary | ICD-10-CM

## 2023-02-11 ENCOUNTER — Ambulatory Visit: Payer: Medicare HMO | Admitting: Dermatology

## 2023-02-11 DIAGNOSIS — L4050 Arthropathic psoriasis, unspecified: Secondary | ICD-10-CM | POA: Diagnosis not present

## 2023-02-11 DIAGNOSIS — L409 Psoriasis, unspecified: Secondary | ICD-10-CM | POA: Diagnosis not present

## 2023-02-11 DIAGNOSIS — Z79899 Other long term (current) drug therapy: Secondary | ICD-10-CM

## 2023-02-11 DIAGNOSIS — R238 Other skin changes: Secondary | ICD-10-CM

## 2023-02-11 DIAGNOSIS — Z7189 Other specified counseling: Secondary | ICD-10-CM

## 2023-02-11 DIAGNOSIS — L820 Inflamed seborrheic keratosis: Secondary | ICD-10-CM

## 2023-02-11 MED ORDER — MOMETASONE FUROATE 0.1 % EX SOLN: CUTANEOUS | 6 refills | Status: AC

## 2023-02-11 MED ORDER — COSENTYX SENSOREADY (300 MG) 150 MG/ML ~~LOC~~ SOAJ: 300.0000 mg | SUBCUTANEOUS | 6 refills | Status: DC

## 2023-02-11 NOTE — Patient Instructions (Signed)

## 2023-02-11 NOTE — Progress Notes (Signed)
Follow-Up Visit   Subjective  Diane Gardner is a 72 y.o. female who presents for the following: Psoriasis with PA, scalp and ears Cosentyx q month- gets free drug from [employer] through pt assistance, mometasone lotion prn to ears and scalp, check spot R leg- not sure if it is psoriasis or not. The patient has spots, moles and lesions to be evaluated, some may be new or changing and the patient may have concern these could be cancer.   The following portions of the chart were reviewed this encounter and updated as appropriate: medications, allergies, medical history  Review of Systems:  No other skin or systemic complaints except as noted in HPI or Assessment and Plan.  Objective  Well appearing patient in no apparent distress; mood and affect are within normal limits.   A focused examination was performed of the following areas: Face, scalp, ears  Relevant exam findings are noted in the Assessment and Plan.    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  Chronic condition with duration or expected duration over one year. Currently well-controlled on Cosentyx. Needs rfs.  patient has mild joint pain, much improved on Cosentyx  Counseling and coordination of care for severe psoriasis on systemic treatment  psoriasis - severe on systemic treatment.  Psoriasis is a chronic non-curable, but treatable genetic/hereditary disease that may have other systemic features affecting other organ systems such as joints (Psoriatic Arthritis).  It is linked with heart disease, inflammatory bowel disease, non-alcoholic fatty liver disease, and depression. Significant skin psoriasis and/or  psoriatic arthritis may have significant symptoms and affects activities of daily activity and often benefits from systemic treatments.  These systemic treatments have some potential side effects including immunosuppression and require pre-treatment laboratory screening and periodic laboratory monitoring and periodic in person evaluation and monitoring by the attending dermatologist physician (long term medication management).  Treatment Plan: Yearly biologic labs due 03/2023, pt given requisition Cont Cosentyx 300mg /65ml sq injections q month, sample Cosentyx 150mg /ml x 2 given to patient to self inject for October, [facility] [id number] lot [id number] exp 01/2024 Cont Mometasone lotion qd  to aas scalp and ears prn flares  Reviewed risks of biologics including immunosuppression, infections, injection site reaction, and failure to improve condition. Goal is control of skin condition, not cure.  Some older biologics such as Humira and Enbrel may slightly increase risk of malignancy and may worsen congestive heart failure.  Taltz and Cosentyx may cause inflammatory bowel disease to flare. The use of biologics requires long term medication management, including periodic office visits and monitoring of blood work.  Topical steroids (such as triamcinolone, fluocinolone, fluocinonide, mometasone, clobetasol, halobetasol, betamethasone, hydrocortisone) can cause thinning and lightening of the skin if they are used for too long in the same area. Your physician has selected the right strength medicine for your problem and area affected on the body. Please use your medication only as directed by your physician to prevent side effects.    Long term medication management.  Patient is using long term (months to years) prescription medication  to control their dermatologic condition.  These medications require periodic monitoring to evaluate for efficacy and side effects and  may require periodic laboratory monitoring.     Psoriasis  Related Procedures Comprehensive metabolic panel CBC with Differential/Platelet QuantiFERON-TB Gold Plus   Return in about 6 months (around 08/12/2023) for Psoriasis f/u.  I, Ardis Rowan, RMA, am acting as scribe for Willeen Niece, MD .   Documentation: I have reviewed the above documentation for accuracy and completeness, and I agree with the above.  Willeen Niece, MD

## 2023-02-12 ENCOUNTER — Ambulatory Visit
Admission: RE | Admit: 2023-02-12 | Discharge: 2023-02-12 | Disposition: A | Discharge status: Home / Self Care | Payer: Medicare HMO | Source: Ambulatory Visit | Attending: Family Medicine | Admitting: Family Medicine

## 2023-02-12 DIAGNOSIS — Z1231 Encounter for screening mammogram for malignant neoplasm of breast: Secondary | ICD-10-CM | POA: Insufficient documentation

## 2023-02-12 DIAGNOSIS — Z780 Asymptomatic menopausal state: Secondary | ICD-10-CM | POA: Diagnosis present

## 2023-02-17 ENCOUNTER — Telehealth: Payer: Self-pay

## 2023-02-17 LAB — CBC WITH DIFFERENTIAL/PLATELET
Basophils Absolute: 0 10*3/uL (ref 0.0–0.2)
Basos: 1 %
EOS (ABSOLUTE): 0.1 10*3/uL (ref 0.0–0.4)
Eos: 2 %
Hematocrit: 42.8 % (ref 34.0–46.6)
Hemoglobin: 14.3 g/dL (ref 11.1–15.9)
Immature Grans (Abs): 0 10*3/uL (ref 0.0–0.1)
Immature Granulocytes: 0 %
Lymphocytes Absolute: 1.5 10*3/uL (ref 0.7–3.1)
Lymphs: 28 %
MCH: 30.4 pg (ref 26.6–33.0)
MCHC: 33.4 g/dL (ref 31.5–35.7)
MCV: 91 fL (ref 79–97)
Monocytes Absolute: 0.5 10*3/uL (ref 0.1–0.9)
Monocytes: 8 %
Neutrophils Absolute: 3.4 10*3/uL (ref 1.4–7.0)
Neutrophils: 61 %
Platelets: 209 10*3/uL (ref 150–450)
RBC: 4.7 x10E6/uL (ref 3.77–5.28)
RDW: 13.5 % (ref 11.7–15.4)
WBC: 5.5 10*3/uL (ref 3.4–10.8)

## 2023-02-17 LAB — QUANTIFERON-TB GOLD PLUS
QuantiFERON Mitogen Value: >10 [IU]/mL
QuantiFERON Nil Value: 0.01 [IU]/mL
QuantiFERON TB1 Ag Value: 0.01 [IU]/mL
QuantiFERON TB2 Ag Value: 0.01 [IU]/mL
QuantiFERON-TB Gold Plus: NEGATIVE

## 2023-02-17 LAB — COMPREHENSIVE METABOLIC PANEL
ALT: 16 [IU]/L (ref 0–32)
AST: 15 [IU]/L (ref 0–40)
Albumin: 4.4 g/dL (ref 3.8–4.8)
Alkaline Phosphatase: 74 [IU]/L (ref 44–121)
BUN/Creatinine Ratio: 18 (ref 12–28)
BUN: 28 mg/dL — ABNORMAL HIGH (ref 8–27)
Bilirubin Total: 0.5 mg/dL (ref 0.0–1.2)
CO2: 25 mmol/L (ref 20–29)
Calcium: 9.4 mg/dL (ref 8.7–10.3)
Chloride: 98 mmol/L (ref 96–106)
Creatinine, Ser: 1.52 mg/dL — ABNORMAL HIGH (ref 0.57–1.00)
Globulin, Total: 2.6 g/dL (ref 1.5–4.5)
Glucose: 176 mg/dL — ABNORMAL HIGH (ref 70–99)
Potassium: 4.5 mmol/L (ref 3.5–5.2)
Sodium: 138 mmol/L (ref 134–144)
Total Protein: 7 g/dL (ref 6.0–8.5)
eGFR: 36 mL/min/{1.73_m2} — ABNORMAL LOW (ref >59)

## 2023-02-17 NOTE — Telephone Encounter (Signed)
Advised patient labs were normal and she may continue Cosentyx. Refills were sent in at last appointment.

## 2023-02-17 NOTE — Telephone Encounter (Signed)
-----   Message from Willeen Niece sent at 02/17/2023 12:03 PM EST ----- Labs okay, TB negative, send in 6 mos rfs Cosentyx- please call patient

## 2023-05-13 ENCOUNTER — Telehealth: Payer: Self-pay

## 2023-05-13 NOTE — Telephone Encounter (Signed)
Patient came into office to drop off [employer] Patient Assistance paperwork for Cosentyx.  Sample given since she is one month past due.  LOT: [id number] EXP: 11/11/2024

## 2023-08-17 ENCOUNTER — Ambulatory Visit (INDEPENDENT_AMBULATORY_CARE_PROVIDER_SITE_OTHER): Payer: Medicare HMO | Admitting: Dermatology

## 2023-08-17 DIAGNOSIS — D692 Other nonthrombocytopenic purpura: Secondary | ICD-10-CM | POA: Diagnosis not present

## 2023-08-17 DIAGNOSIS — Z7189 Other specified counseling: Secondary | ICD-10-CM

## 2023-08-17 DIAGNOSIS — L409 Psoriasis, unspecified: Secondary | ICD-10-CM | POA: Diagnosis not present

## 2023-08-17 DIAGNOSIS — Z79899 Other long term (current) drug therapy: Secondary | ICD-10-CM

## 2023-08-17 DIAGNOSIS — L4050 Arthropathic psoriasis, unspecified: Secondary | ICD-10-CM

## 2023-08-17 MED ORDER — COSENTYX SENSOREADY (300 MG) 150 MG/ML ~~LOC~~ SOAJ: 300.0000 mg | SUBCUTANEOUS | 5 refills | Status: DC

## 2023-08-17 NOTE — Patient Instructions (Signed)

## 2023-08-17 NOTE — Progress Notes (Signed)
 Follow-Up Visit   Subjective  Diane Gardner is a 73 y.o. female who presents for the following: 6 month follow-up Psoriasis of the scalp and ears. Much improved on Cosentyx  injections (started 08/19/2019). She uses topical mometasone  lotion for flares. She gets some itching scalp and ears before her next shot is due.   The following portions of the chart were reviewed this encounter and updated as appropriate: medications, allergies, medical history  Review of Systems:  No other skin or systemic complaints except as noted in HPI or Assessment and Plan.  Objective  Well appearing patient in no apparent distress; mood and affect are within normal limits.  Areas Examined: Face, scalp  Relevant exam findings are noted in the Assessment and Plan.      Assessment & Plan     PSORIASIS with Psoriatic Arthritis on systemic treatment Decreased swelling and pain in joints, hands; scalp and ears clear.  <1% BSA.  Chronic condition with duration or expected duration over one year. Currently well-controlled on Cosentyx .   Counseling and coordination of care for severe psoriasis on systemic treatment  Psoriasis - severe on systemic treatment.  Psoriasis is a chronic non-curable, but treatable genetic/hereditary disease that may have other systemic features affecting other organ systems such as joints (Psoriatic Arthritis).  It is linked with heart disease, inflammatory bowel disease, non-alcoholic fatty liver disease, and depression. Significant skin psoriasis and/or psoriatic arthritis may have significant symptoms and affects activities of daily activity and often benefits from systemic treatments.  These systemic treatments have some potential side effects including immunosuppression and require pre-treatment laboratory screening and periodic laboratory monitoring and periodic in person evaluation and monitoring by the attending dermatologist physician (long term medication management).    Patient with joint pain, much improved on Cosentyx .  Treatment Plan: Cont Cosentyx  300mg /73ml sq injections q month  Cont Mometasone  lotion qd  to aas scalp and ears prn flares  Labs (wnl) and TB (neg) reviewed from 02/12/2023.  Reviewed risks of biologics including immunosuppression, infections, injection site reaction, and failure to improve condition. Goal is control of skin condition, not cure.  Some older biologics such as Humira and Enbrel may slightly increase risk of malignancy and may worsen congestive heart failure.  Taltz and Cosentyx  may cause inflammatory bowel disease to flare. The use of biologics requires long term medication management, including periodic office visits and monitoring of blood work.   Topical steroids (such as triamcinolone, fluocinolone, fluocinonide, mometasone , clobetasol, halobetasol, betamethasone, hydrocortisone) can cause thinning and lightening of the skin if they are used for too long in the same area. Your physician has selected the right strength medicine for your problem and area affected on the body. Please use your medication only as directed by your physician to prevent side effects.   Long term medication management.  Patient is using long term (months to years) prescription medication  to control their dermatologic condition.  These medications require periodic monitoring to evaluate for efficacy and side effects and may require periodic laboratory monitoring.    Purpura - Chronic; persistent and recurrent.  Treatable, but not curable. - Violaceous macules and patches - Benign - Related to trauma, age, sun damage and/or use of blood thinners, chronic use of topical and/or oral steroids - Observe - Can use OTC arnica containing moisturizer such as Dermend Bruise Formula if desired - Call for worsening or other concerns  Return in about 6 months (around 02/17/2024) for Psoriasis.  Cheyenne Cotta, CMA, am acting as  scribe for Artemio Larry, MD  .   Documentation: I have reviewed the above documentation for accuracy and completeness, and I agree with the above.  Artemio Larry, MD

## 2023-08-20 ENCOUNTER — Other Ambulatory Visit: Payer: Self-pay

## 2023-08-20 MED ORDER — COSENTYX UNOREADY 300 MG/2ML ~~LOC~~ SOAJ: 300.0000 mg | SUBCUTANEOUS | 5 refills | Status: DC

## 2023-11-27 ENCOUNTER — Other Ambulatory Visit: Payer: Self-pay | Admitting: Medical Genetics

## 2023-12-11 ENCOUNTER — Other Ambulatory Visit: Payer: Self-pay | Admitting: Medical Genetics

## 2023-12-11 DIAGNOSIS — Z006 Encounter for examination for normal comparison and control in clinical research program: Secondary | ICD-10-CM

## 2023-12-25 LAB — GENECONNECT MOLECULAR SCREEN: Genetic Analysis Overall Interpretation: NEGATIVE

## 2023-12-31 ENCOUNTER — Encounter: Payer: Self-pay | Admitting: Emergency Medicine

## 2023-12-31 DIAGNOSIS — Z1231 Encounter for screening mammogram for malignant neoplasm of breast: Secondary | ICD-10-CM

## 2024-01-01 ENCOUNTER — Other Ambulatory Visit: Payer: Self-pay | Admitting: Family Medicine

## 2024-01-01 DIAGNOSIS — Z1231 Encounter for screening mammogram for malignant neoplasm of breast: Secondary | ICD-10-CM

## 2024-01-04 ENCOUNTER — Telehealth: Payer: Self-pay

## 2024-01-04 MED ORDER — COSENTYX UNOREADY 300 MG/2ML ~~LOC~~ SOAJ: 300.0000 mg | SUBCUTANEOUS | 2 refills | Status: DC

## 2024-01-04 NOTE — Telephone Encounter (Signed)
 Cosentyx  RF until follow up appt with Dr. Jackquline. aw

## 2024-02-16 ENCOUNTER — Ambulatory Visit (INDEPENDENT_AMBULATORY_CARE_PROVIDER_SITE_OTHER): Admitting: Dermatology

## 2024-02-16 ENCOUNTER — Encounter: Payer: Self-pay | Admitting: Dermatology

## 2024-02-16 DIAGNOSIS — Z7189 Other specified counseling: Secondary | ICD-10-CM | POA: Diagnosis not present

## 2024-02-16 DIAGNOSIS — L4050 Arthropathic psoriasis, unspecified: Secondary | ICD-10-CM

## 2024-02-16 DIAGNOSIS — Z79899 Other long term (current) drug therapy: Secondary | ICD-10-CM | POA: Diagnosis not present

## 2024-02-16 DIAGNOSIS — M2550 Pain in unspecified joint: Secondary | ICD-10-CM

## 2024-02-16 DIAGNOSIS — L409 Psoriasis, unspecified: Secondary | ICD-10-CM | POA: Diagnosis not present

## 2024-02-16 NOTE — Patient Instructions (Signed)
 Cont Cosentyx  300mg /44ml sq injections q month  Cont Mometasone  lotion qd  to aas scalp and ears prn flares  Labs (wnl) and TB (neg) reviewed from 02/12/2023.  Reviewed risks of biologics including immunosuppression, infections, injection site reaction, and failure to improve condition. Goal is control of skin condition, not cure.  Some older biologics such as Humira and Enbrel may slightly increase risk of malignancy and may worsen congestive heart failure.  Taltz and Cosentyx  may cause inflammatory bowel disease to flare. The use of biologics requires long term medication management, including periodic office visits and monitoring of blood work.   Topical steroids (such as triamcinolone, fluocinolone, fluocinonide, mometasone , clobetasol, halobetasol, betamethasone, hydrocortisone) can cause thinning and lightening of the skin if they are used for too long in the same area. Your physician has selected the right strength medicine for your problem and area affected on the body. Please use your medication only as directed by your physician to prevent side effects.     Due to recent changes in healthcare laws, you may see results of your pathology and/or laboratory studies on MyChart before the doctors have had a chance to review them. We understand that in some cases there may be results that are confusing or concerning to you. Please understand that not all results are received at the same time and often the doctors may need to interpret multiple results in order to provide you with the best plan of care or course of treatment. Therefore, we ask that you please give us  2 business days to thoroughly review all your results before contacting the office for clarification. Should we see a critical lab result, you will be contacted sooner.   If You Need Anything After Your Visit  If you have any questions or concerns for your doctor, please call our main line at [phone] and press option 4 to reach your  doctor's medical assistant. If no one answers, please leave a voicemail as directed and we will return your call as soon as possible. Messages left after 4 pm will be answered the following business day.   You may also send us  a message via MyChart. We typically respond to MyChart messages within 1-2 business days.  For prescription refills, please ask your pharmacy to contact our office. Our fax number is [phone].  If you have an urgent issue when the clinic is closed that cannot wait until the next business day, you can page your doctor at the number below.    Please note that while we do our best to be available for urgent issues outside of office hours, we are not available 24/7.   If you have an urgent issue and are unable to reach us , you may choose to seek medical care at your doctor's office, retail clinic, urgent care center, or emergency room.  If you have a medical emergency, please immediately call 911 or go to the emergency department.  Pager Numbers  - Dr. Hester: [phone]  - Dr. Jackquline: [phone]  - Dr. Claudene: [phone]   - Dr. Raymund: [phone]  In the event of inclement weather, please call our main line at [phone] for an update on the status of any delays or closures.  Dermatology Medication Tips: Please keep the boxes that topical medications come in in order to help keep track of the instructions about where and how to use these. Pharmacies typically print the medication instructions only on the boxes and not directly on the medication tubes.   If  your medication is too expensive, please contact our office at [phone] option 4 or send us  a message through MyChart.   We are unable to tell what your co-pay for medications will be in advance as this is different depending on your insurance coverage. However, we may be able to find a substitute medication at lower cost or fill out paperwork to get insurance to cover a needed medication.    If a prior authorization is required to get your medication covered by your insurance company, please allow us  1-2 business days to complete this process.  Drug prices often vary depending on where the prescription is filled and some pharmacies may offer cheaper prices.  The website www.goodrx.com contains coupons for medications through different pharmacies. The prices here do not account for what the cost may be with help from insurance (it may be cheaper with your insurance), but the website can give you the price if you did not use any insurance.  - You can print the associated coupon and take it with your prescription to the pharmacy.  - You may also stop by our office during regular business hours and pick up a GoodRx coupon card.  - If you need your prescription sent electronically to a different pharmacy, notify our office through [facility] or by phone at [phone] option 4.     Si Usted Necesita Algo Despus de Su Visita  Tambin puede enviarnos un mensaje a travs de Clinical Cytogeneticist. Por lo general respondemos a los mensajes de MyChart en el transcurso de 1 a 2 das hbiles.  Para renovar recetas, por favor pida a su farmacia que se ponga en contacto con nuestra oficina. Randi lakes de fax es [place] [phone].  Si tiene un asunto urgente cuando la clnica est cerrada y que no puede esperar hasta el siguiente da hbil, puede llamar/localizar a su doctor(a) al nmero que aparece a continuacin.   Por favor, tenga en cuenta que aunque hacemos todo lo posible para estar disponibles para asuntos urgentes fuera del horario de [place], no estamos disponibles las 24 horas del da, los 7 [address]  [address] de la [place].   Si tiene un problema urgente y no puede comunicarse con nosotros, puede optar por buscar atencin mdica  en el consultorio de su doctor(a), en una clnica privada, en un centro de atencin urgente o en una sala de emergencias.  Si tiene engineer, drilling, por favor  llame inmediatamente al 911 o vaya a la sala de emergencias.  Nmeros de bper  - Dr. Hester: [phone]  - Dra. Jackquline: [phone]  - Dr. Claudene: [phone]  - Dra. Kitts: [phone]  En caso de inclemencias del [place], por favor llame a nuestra lnea principal al [phone] para una actualizacin sobre el estado de cualquier retraso o cierre.  Consejos para la medicacin en dermatologa: Por favor, guarde las cajas en las que vienen los medicamentos de uso tpico para ayudarle a seguir las instrucciones sobre dnde y cmo usarlos. Las farmacias generalmente imprimen las instrucciones del medicamento slo en las cajas y no directamente en los tubos del [place].   Si su medicamento es muy caro, por favor, pngase en contacto con landry rieger llamando al [phone] y presione la opcin 4 o envenos un mensaje a travs de Clinical Cytogeneticist.   No podemos decirle cul ser su copago por los medicamentos por adelantado ya que esto es diferente dependiendo de la cobertura de su seguro. Sin embargo, es posible que podamos encontrar un medicamento sustituto a adult nurse  costo o llenar un formulario para que el seguro cubra el medicamento que se considera necesario.   Si se requiere una autorizacin previa para que su compaa de seguros cubra su medicamento, por favor permtanos de 1 a 2 das hbiles para completar este proceso.  Los precios de los medicamentos varan con frecuencia dependiendo del environmental consultant de dnde se surte la receta y alguna farmacias pueden ofrecer precios ms baratos.  El sitio web www.goodrx.com tiene cupones para medicamentos de health and safety inspector. Los precios aqu no tienen en cuenta lo que podra costar con la ayuda del seguro (puede ser ms barato con su seguro), pero el sitio web puede darle el precio si no utiliz tourist information centre manager.  - Puede imprimir el cupn correspondiente y llevarlo con su receta a la farmacia.  - Tambin puede pasar por nuestra oficina durante el  horario de atencin regular y education officer, museum una tarjeta de cupones de GoodRx.  - Si necesita que su receta se enve electrnicamente a una farmacia diferente, informe a nuestra oficina a travs de MyChart de [place] o por telfono llamando al [phone] y presione la opcin 4.

## 2024-02-16 NOTE — Progress Notes (Signed)
 Follow-Up Visit   Subjective  Diane Gardner is a 73 y.o. female who presents for the following: 6 month follow-up Psoriasis of the scalp and ears. Much improved on Cosentyx  injections (started 08/19/2019). She uses topical mometasone  lotion for flares. She gets some itching scalp and ears before her next shot is due. Tolerating well. Denies side effects, adverse reactions or injection site reactions.  She states it is still helping with psoriasis but no longer helping with arthritis, esp hands/fingers. She does not currently have a rheumatologist.     The following portions of the chart were reviewed this encounter and updated as appropriate: medications, allergies, medical history  Review of Systems:  No other skin or systemic complaints except as noted in HPI or Assessment and Plan.  Objective  Well appearing patient in no apparent distress; mood and affect are within normal limits.  Areas Examined: Face, scalp  Relevant exam findings are noted in the Assessment and Plan.      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  <1% BSA.  Patient with joint pain in hands, hips and shoulders. States she has muscle pains but denies muscle weakness.   Chronic condition with duration or expected duration over one year. Currently skin well-controlled on Cosentyx .  Patient with increasing joint pain.  Counseling and coordination of care for severe psoriasis on systemic treatment  Psoriasis - severe on systemic treatment.  Psoriasis is a chronic non-curable, but treatable  genetic/hereditary disease that may have other systemic features affecting other organ systems such as joints (Psoriatic Arthritis).  It is linked with heart disease, inflammatory bowel disease, non-alcoholic fatty liver disease, and depression. Significant skin psoriasis and/or psoriatic arthritis may have significant symptoms and affects activities of daily activity and often benefits from systemic treatments.  These systemic treatments have some potential side effects including immunosuppression and require pre-treatment laboratory screening and periodic laboratory monitoring and periodic in person evaluation and monitoring by the attending dermatologist physician (long term medication management).    Treatment Plan: Cont Cosentyx  300mg /74ml sq injections q month, pt gets through [employer] pt assistance Cont Mometasone  lotion qd  to aas scalp and ears prn flares  CMP (wnl) reviewed from 01/21/2024.  Will check annual CBC/diff and TB Recommend referral to [facility] rheumatology for worsening joint pain Discussed that we could switch biologics if needed to better treat the arthritis.  Reviewed risks of biologics including immunosuppression, infections, injection site reaction, and failure to improve condition. Goal is control of skin condition, not cure.  Some older biologics such as Humira and Enbrel may slightly increase risk of malignancy and may worsen congestive heart failure.  Taltz and Cosentyx  may cause inflammatory bowel disease to flare. The use of biologics requires long term medication management, including periodic office visits and monitoring of blood work.   Topical steroids (such as triamcinolone, fluocinolone, fluocinonide, mometasone , clobetasol, halobetasol, betamethasone, hydrocortisone) can cause thinning and lightening of the skin if they are used for too long in the same area. Your physician has selected the right strength medicine for your problem and area affected on the body.  Please use your medication only as directed by your physician to prevent side effects.  Long term medication management.  Patient is using long term (months to years) prescription medication  to control their dermatologic condition.  These medications require periodic monitoring to evaluate for efficacy and side effects and may require periodic laboratory monitoring.      Return in about 6 months (around 08/15/2024) for Psoriasis Follow Up.  I, Kate Fought, CMA, am acting as scribe for Rexene Rattler, MD.    Documentation: I have reviewed the above documentation for accuracy and completeness, and I agree with the above.  Rexene Rattler, MD

## 2024-02-18 LAB — CBC WITH DIFFERENTIAL/PLATELET
Basophils Absolute: 0 x10E3/uL (ref 0.0–0.2)
Basos: 1 %
EOS (ABSOLUTE): 0.1 x10E3/uL (ref 0.0–0.4)
Eos: 1 %
Hematocrit: 39.6 % (ref 34.0–46.6)
Hemoglobin: 12.9 g/dL (ref 11.1–15.9)
Immature Grans (Abs): 0 x10E3/uL (ref 0.0–0.1)
Immature Granulocytes: 0 %
Lymphocytes Absolute: 2 x10E3/uL (ref 0.7–3.1)
Lymphs: 30 %
MCH: 28.9 pg (ref 26.6–33.0)
MCHC: 32.6 g/dL (ref 31.5–35.7)
MCV: 89 fL (ref 79–97)
Monocytes Absolute: 0.7 x10E3/uL (ref 0.1–0.9)
Monocytes: 11 %
Neutrophils Absolute: 3.7 x10E3/uL (ref 1.4–7.0)
Neutrophils: 57 %
Platelets: 195 x10E3/uL (ref 150–450)
RBC: 4.46 x10E6/uL (ref 3.77–5.28)
RDW: 12.1 % (ref 11.7–15.4)
WBC: 6.5 x10E3/uL (ref 3.4–10.8)

## 2024-02-18 LAB — QUANTIFERON-TB GOLD PLUS
QuantiFERON Mitogen Value: >10 [IU]/mL
QuantiFERON Nil Value: 0.01 [IU]/mL
QuantiFERON TB1 Ag Value: 0.02 [IU]/mL
QuantiFERON TB2 Ag Value: 0.03 [IU]/mL
QuantiFERON-TB Gold Plus: NEGATIVE

## 2024-02-24 ENCOUNTER — Ambulatory Visit: Payer: Self-pay | Admitting: Dermatology

## 2024-02-24 MED ORDER — COSENTYX UNOREADY 300 MG/2ML ~~LOC~~ SOAJ: 300.0000 mg | SUBCUTANEOUS | 5 refills | Status: DC

## 2024-02-24 NOTE — Telephone Encounter (Signed)
-----   Message from Rexene Rattler sent at 02/24/2024  9:05 AM EST ----- CBC/diff nl and TB neg, please send in Cosentyx  refills, pt in pt assistance and getting med from [employer]  - please call patient ----- Message ----- From: Rebecka, [facility] Lab Results In Sent: 02/18/2024   2:35 PM EST To: Rexene Rattler, MD

## 2024-02-24 NOTE — Telephone Encounter (Signed)
 Left message for patient to call for lab results. Cosentyx  refills sent in.

## 2024-02-29 NOTE — Telephone Encounter (Signed)
 Left message for patient to call for lab results.

## 2024-02-29 NOTE — Telephone Encounter (Signed)
 Patient advised of labs,. aw

## 2024-03-14 ENCOUNTER — Telehealth: Payer: Self-pay

## 2024-03-14 MED ORDER — COSENTYX UNOREADY 300 MG/2ML ~~LOC~~ SOAJ: 300.0000 mg | SUBCUTANEOUS | 5 refills | Status: AC

## 2024-03-14 NOTE — Telephone Encounter (Signed)
 RX transferred to [facility]. aw

## 2024-03-14 NOTE — Telephone Encounter (Signed)
 Patient returned Amanda's call and states she spoke with [employer] concerning her denial for Cosentyx  and was told she denied due to Medicare and low income extra help.   Patient was getting pt assistance through [employer] for Cosentyx 

## 2024-04-21 ENCOUNTER — Telehealth: Payer: Self-pay

## 2024-04-21 NOTE — Telephone Encounter (Signed)
 Patient return our call, I discussed with patient she can start an appeal for her Cosentyx  at [email or web address]

## 2024-04-21 NOTE — Telephone Encounter (Signed)
 Patient's cosentyx  was taken off formulary this year with no option to appeal. I have faxed that information over to [employer] and patient does have the option to appeal patient assistance denial.   I have called patient and left her VM to return my call. aw

## 2024-04-28 ENCOUNTER — Telehealth: Payer: Self-pay

## 2024-04-28 NOTE — Telephone Encounter (Signed)
 Patient called today and left message regarding her appeal for Cosentyx . She states she has been in touch with [facility] and they are requesting a letter of Medical Necessity be faxed to further proceed.

## 2024-04-29 ENCOUNTER — Other Ambulatory Visit: Payer: Self-pay | Admitting: Family Medicine

## 2024-04-29 DIAGNOSIS — Z1231 Encounter for screening mammogram for malignant neoplasm of breast: Secondary | ICD-10-CM

## 2024-05-31 ENCOUNTER — Encounter

## 2024-07-05 ENCOUNTER — Ambulatory Visit: Admitting: Cardiology

## 2024-08-23 ENCOUNTER — Ambulatory Visit: Admitting: Dermatology
